# Patient Record
Sex: Female | Born: 1960 | Race: White | Hispanic: No | Marital: Single | Smoking: Former smoker
Health system: Southern US, Community
[De-identification: ages and names within clinical notes are randomized; demographics above are authoritative.]

## PROBLEM LIST (undated history)

## (undated) ENCOUNTER — Ambulatory Visit (HOSPITAL_BASED_OUTPATIENT_CLINIC_OR_DEPARTMENT_OTHER): Source: Home / Self Care

## (undated) DIAGNOSIS — J45909 Unspecified asthma, uncomplicated: Secondary | ICD-10-CM

## (undated) DIAGNOSIS — G4730 Sleep apnea, unspecified: Secondary | ICD-10-CM

## (undated) DIAGNOSIS — M1990 Unspecified osteoarthritis, unspecified site: Secondary | ICD-10-CM

## (undated) DIAGNOSIS — M810 Age-related osteoporosis without current pathological fracture: Secondary | ICD-10-CM

## (undated) DIAGNOSIS — F319 Bipolar disorder, unspecified: Secondary | ICD-10-CM

## (undated) DIAGNOSIS — Z87442 Personal history of urinary calculi: Secondary | ICD-10-CM

## (undated) DIAGNOSIS — G4700 Insomnia, unspecified: Secondary | ICD-10-CM

## (undated) DIAGNOSIS — G43909 Migraine, unspecified, not intractable, without status migrainosus: Secondary | ICD-10-CM

## (undated) DIAGNOSIS — M797 Fibromyalgia: Secondary | ICD-10-CM

## (undated) DIAGNOSIS — K219 Gastro-esophageal reflux disease without esophagitis: Secondary | ICD-10-CM

## (undated) DIAGNOSIS — N189 Chronic kidney disease, unspecified: Secondary | ICD-10-CM

## (undated) DIAGNOSIS — R519 Headache, unspecified: Secondary | ICD-10-CM

## (undated) DIAGNOSIS — F32A Depression, unspecified: Secondary | ICD-10-CM

## (undated) DIAGNOSIS — F419 Anxiety disorder, unspecified: Secondary | ICD-10-CM

## (undated) HISTORY — DX: Insomnia, unspecified: G47.00

## (undated) HISTORY — DX: Chronic kidney disease, unspecified: N18.9

## (undated) HISTORY — DX: Age-related osteoporosis without current pathological fracture: M81.0

## (undated) HISTORY — PX: NO PAST SURGERIES: SHX2092

## (undated) HISTORY — DX: Migraine, unspecified, not intractable, without status migrainosus: G43.909

## (undated) HISTORY — PX: APPENDECTOMY: SHX54

## (undated) HISTORY — DX: Unspecified osteoarthritis, unspecified site: M19.90

## (undated) HISTORY — PX: TUBAL LIGATION: SHX77

## (undated) HISTORY — DX: Unspecified asthma, uncomplicated: J45.909

## (undated) HISTORY — PX: CHOLECYSTECTOMY: SHX55

---

## 2001-05-27 ENCOUNTER — Other Ambulatory Visit: Admission: RE | Admit: 2001-05-27 | Discharge: 2001-05-27 | Payer: Self-pay | Admitting: Obstetrics and Gynecology

## 2016-04-08 HISTORY — PX: ABDOMINAL HYSTERECTOMY: SHX81

## 2016-11-19 ENCOUNTER — Emergency Department (HOSPITAL_COMMUNITY): Payer: Self-pay

## 2016-11-19 ENCOUNTER — Emergency Department (HOSPITAL_COMMUNITY)
Admission: EM | Admit: 2016-11-19 | Discharge: 2016-11-19 | Disposition: A | Discharge status: Home / Self Care | Payer: Self-pay | Attending: Emergency Medicine | Admitting: Emergency Medicine

## 2016-11-19 DIAGNOSIS — R109 Unspecified abdominal pain: Secondary | ICD-10-CM

## 2016-11-19 DIAGNOSIS — Z9049 Acquired absence of other specified parts of digestive tract: Secondary | ICD-10-CM | POA: Insufficient documentation

## 2016-11-19 DIAGNOSIS — R197 Diarrhea, unspecified: Secondary | ICD-10-CM | POA: Insufficient documentation

## 2016-11-19 DIAGNOSIS — N200 Calculus of kidney: Secondary | ICD-10-CM | POA: Insufficient documentation

## 2016-11-19 DIAGNOSIS — R300 Dysuria: Secondary | ICD-10-CM | POA: Insufficient documentation

## 2016-11-19 DIAGNOSIS — R110 Nausea: Secondary | ICD-10-CM | POA: Insufficient documentation

## 2016-11-19 DIAGNOSIS — F17200 Nicotine dependence, unspecified, uncomplicated: Secondary | ICD-10-CM | POA: Insufficient documentation

## 2016-11-19 DIAGNOSIS — K5090 Crohn's disease, unspecified, without complications: Secondary | ICD-10-CM | POA: Insufficient documentation

## 2016-11-19 DIAGNOSIS — R079 Chest pain, unspecified: Secondary | ICD-10-CM | POA: Insufficient documentation

## 2016-11-19 LAB — CBC
HEMATOCRIT: 41.9 % (ref 36.0–46.0)
HEMOGLOBIN: 13.8 g/dL (ref 12.0–15.0)
MCH: 31.4 pg (ref 26.0–34.0)
MCHC: 32.9 g/dL (ref 30.0–36.0)
MCV: 95.4 fL (ref 78.0–100.0)
Platelets: 273 10*3/uL (ref 150–400)
RBC: 4.39 MIL/uL (ref 3.87–5.11)
RDW: 13.2 % (ref 11.5–15.5)
WBC: 11.6 10*3/uL — AB (ref 4.0–10.5)

## 2016-11-19 LAB — BASIC METABOLIC PANEL
ANION GAP: 13 (ref 5–15)
BUN: 11 mg/dL (ref 6–20)
CHLORIDE: 102 mmol/L (ref 101–111)
CO2: 20 mmol/L — ABNORMAL LOW (ref 22–32)
Calcium: 8.6 mg/dL — ABNORMAL LOW (ref 8.9–10.3)
Creatinine, Ser: 1.07 mg/dL — ABNORMAL HIGH (ref 0.44–1.00)
GFR calc Af Amer: >60 mL/min (ref >60)
GFR, EST NON AFRICAN AMERICAN: 59 mL/min — AB (ref >60)
Glucose, Bld: 136 mg/dL — ABNORMAL HIGH (ref 65–99)
POTASSIUM: 3.8 mmol/L (ref 3.5–5.1)
SODIUM: 135 mmol/L (ref 135–145)

## 2016-11-19 MED ORDER — FENTANYL CITRATE (PF) 100 MCG/2ML IJ SOLN
INTRAMUSCULAR | Status: AC
  Filled 2016-11-19: qty 2

## 2016-11-19 MED ORDER — HYDROMORPHONE HCL 1 MG/ML IJ SOLN
1.0000 mg | Freq: Once | INTRAMUSCULAR | Status: AC
  Administered 2016-11-19: 1 mg via INTRAVENOUS
  Filled 2016-11-19: qty 1

## 2016-11-19 MED ORDER — KETOROLAC TROMETHAMINE 30 MG/ML IJ SOLN
30.0000 mg | Freq: Once | INTRAMUSCULAR | Status: AC
  Administered 2016-11-19: 30 mg via INTRAVENOUS
  Filled 2016-11-19: qty 1

## 2016-11-19 MED ORDER — ONDANSETRON HCL 4 MG/2ML IJ SOLN
INTRAMUSCULAR | Status: AC
  Filled 2016-11-19: qty 2

## 2016-11-19 MED ORDER — FENTANYL CITRATE (PF) 100 MCG/2ML IJ SOLN
50.0000 ug | INTRAMUSCULAR | Status: DC | PRN
  Administered 2016-11-19: 50 ug via INTRAVENOUS

## 2016-11-19 MED ORDER — ONDANSETRON HCL 4 MG/2ML IJ SOLN
4.0000 mg | Freq: Once | INTRAMUSCULAR | Status: AC
  Administered 2016-11-19: 4 mg via INTRAVENOUS

## 2016-11-19 NOTE — ED Notes (Signed)
Pt attempted to use the restroom, unable to pee.

## 2016-11-19 NOTE — ED Notes (Signed)
Placed bedpan under pt, pt wanted the bed pan to stay under her.

## 2016-11-19 NOTE — ED Provider Notes (Signed)
11:27 PM Pt also seena nd examined by me. Pt with sudden on set of left flank pain radiating into left groin. Hx of crohns disease, currently well controlled. Pt's VS are normal. She is however appears to be in severe pain, she is moaning and rolling around in the stretcher. Labs ordered. Abdomen tender in LLQ. Peripheral pulses intact. Question kidney stone. Will get ct renal study for evaluation.   12:53 AM CT consistent with two punctate stones in distal UVJ. Pain initially improved with dilaudid and toradol but now starting to come back. Will give another dose of dilaudid.   Signed out to PA [place] at shift change. Unable to give UA at this time. Plan follow up on UA and pain management.   Vitals:   11/19/16 2211  BP: 137/89  Pulse: 71  Resp: (!) 22  Temp: 98.2 F (36.8 C)  TempSrc: Oral  SpO2: 100%      Jaynie Crumble, PA-C 11/20/16 5277    Tegeler, Canary Brim, MD 11/20/16 1245

## 2016-11-19 NOTE — ED Triage Notes (Signed)
Pt had abdominal pain last night with diarrhea. Had sudden onset of L flank and lower abdominal pain two hours ago. Reports this pain is much more severe than pain associated with crohns. Pt covered in vomit, moaning loudly in triage

## 2016-11-19 NOTE — ED Provider Notes (Signed)
[facility]EM[facility]ENCY DEPT Provider Note   CSN: [id number] Arrival date & time: 11/19/16  2159     History   Chief Complaint Chief Complaint  Patient presents with  . Flank Pain  . Abdominal Pain    HPI Barbara Edwards is a 56 y.o. female.  HPI   Barbara Edwards is a 56 y.o. female with a history of Crohn's disease and multiple abdominal surgeries, presents for evaluation of left flank and left lower abdominal pain. Patient reports non-specific abdominal pain began this afternoon with associated diarrhea and nausea. Patient states about 2 hours ago pain became very severe and concentrated in the left flank and lower abdomen. Patient denies any aggravating or alleviating factors and did not take anything prior to arrival to alleviate the pain. Patient reports pain with urination, last void earlier today. Patient has vomited several times since arrival. Patient denies any blood or dark stools or emesis, denies epigastric pain. Patient reports some chest pain over the past two days, which she describes as "like a toothache", but denies current chest pain and reports history of GERD. Patient exhibits significant discomfort throughout interview and is very restless and history is limited. Patient denies history of renal stones, reports multiple UTI's in the past. She reports her Crohn's has been in remission and does not require chronic medication.  Past Medical History:  Diagnosis Date  . Crohn's disease (HCC)     There are no active problems to display for this patient.   Past Surgical History:  Procedure Laterality Date  . ABDOMINAL HYSTERECTOMY    . APPENDECTOMY    . CHOLECYSTECTOMY    . TUBAL LIGATION      OB History    No data available       Home Medications    Prior to Admission medications   Not on File    Family History No family history on file.  Social History Social History  Substance Use Topics  . Smoking status: Current Every Day Smoker  . Smokeless  tobacco: Never Used  . Alcohol use Yes     Allergies   Patient has no known allergies.   Review of Systems Review of Systems  Constitutional: Positive for appetite change (decreased). Negative for fever.  Respiratory: Negative for chest tightness and shortness of breath.   Cardiovascular: Positive for chest pain. Negative for palpitations.  Gastrointestinal: Positive for abdominal pain, diarrhea, nausea and vomiting. Negative for abdominal distention and blood in stool.  Genitourinary: Positive for dysuria and flank pain. Negative for pelvic pain.  All other systems reviewed and are negative.    Physical Exam Updated Vital Signs BP 137/89 (BP Location: Left Arm)   Pulse 71   Temp 98.2 F (36.8 C) (Oral)   Resp (!) 22   SpO2 100%   Physical Exam  Constitutional: She is oriented to person, place, and time. She appears well-developed and well-nourished. She appears distressed (patietn in significant pain throughout exam).  HENT:  Head: Normocephalic and atraumatic.  Neck: Neck supple.  Cardiovascular: Normal rate, regular rhythm, normal heart sounds, intact distal pulses and normal pulses.   Pulmonary/Chest: Effort normal and breath sounds normal.  Abdominal: Soft. Bowel sounds are normal. She exhibits no pulsatile midline mass and no mass. There is tenderness in the suprapubic area and left lower quadrant. There is CVA tenderness (mild left sided).  Neurological: She is alert and oriented to person, place, and time.  Skin: Skin is warm and dry.  Nursing note and vitals  reviewed.    ED Treatments / Results  Labs (all labs ordered are listed, but only abnormal results are displayed) Labs Reviewed  BASIC METABOLIC PANEL - Abnormal; Notable for the following:       Result Value   CO2 20 (*)    Glucose, Bld 136 (*)    Creatinine, Ser 1.07 (*)    Calcium 8.6 (*)    GFR calc non Af Amer 59 (*)    All other components within normal limits  CBC - Abnormal; Notable for the  following:    WBC 11.6 (*)    All other components within normal limits  URINALYSIS, ROUTINE W REFLEX MICROSCOPIC    EKG  EKG Interpretation  Date/Time:  Tuesday November 19 2016 23:13:27 EDT Ventricular Rate:  76 PR Interval:    QRS Duration: 98 QT Interval:  410 QTC Calculation: 461 R Axis:   91 Text Interpretation:  Sinus rhythm Borderline right axis deviation Otherwise within normal limits No old tracing to compare Confirmed by Dione Booze ([id number]) on 11/19/2016 11:17:30 PM       Radiology Ct Renal Stone Study  Result Date: 11/19/2016 CLINICAL DATA:  Flank pain with nausea vomiting, left-sided history of Crohn's EXAM: CT ABDOMEN AND PELVIS WITHOUT CONTRAST TECHNIQUE: Multidetector CT imaging of the abdomen and pelvis was performed following the standard protocol without IV contrast. COMPARISON:  None. FINDINGS: Lower chest: Lung bases demonstrate no acute consolidation or pleural effusion. Normal heart size Hepatobiliary: No focal liver abnormality is seen. Status post cholecystectomy. No biliary dilatation. Pancreas: Unremarkable. No pancreatic ductal dilatation or surrounding inflammatory changes. Spleen: Normal in size without focal abnormality. Adrenals/Urinary Tract: Adrenal glands are within normal limits. Mild left perinephric fat stranding. Mild left hydronephrosis and hydroureter. Coronal views demonstrate a punctate stone within the left aspect of the bladder, series 5, image number 42. There is an additional punctate stone at the base of the bladder, series 5, image number 40. Negative for right hydronephrosis. Bladder otherwise normal Stomach/Bowel: Stomach within normal limits. No dilated small bowel. Slight wall thickening of the transverse colon felt due to underdistention. No definitive inflammation. Minimal sigmoid colon diverticula. Vascular/Lymphatic: No significant vascular findings are present. No enlarged abdominal or pelvic lymph nodes. Reproductive: Uterus and  bilateral adnexa are unremarkable. Other: Negative for free air or free fluid Musculoskeletal: No acute or significant osseous findings. IMPRESSION: 1. Mild left perinephric fat stranding. Mild left hydronephrosis and hydroureter. Punctate stone within or just distal to the left UVJ. Additional punctate stone at the base of the bladder. Findings consistent with recently passed or imminent passage of stones. 2. Slight wall thickening of transverse colon, likely due to underdistention, no convincing evidence for inflammation at this time Electronically Signed   By: Jasmine Pang M.D.   On: 11/19/2016 23:58    Procedures Procedures (including critical care time)  Medications Ordered in ED Medications  fentaNYL (SUBLIMAZE) injection 50 mcg (50 mcg Intravenous Given 11/19/16 2215)  ondansetron (ZOFRAN) 4 MG/2ML injection (not administered)  fentaNYL (SUBLIMAZE) 100 MCG/2ML injection (not administered)  sodium chloride 0.9 % bolus 500 mL (not administered)  HYDROmorphone (DILAUDID) injection 1 mg (not administered)  tamsulosin (FLOMAX) capsule 0.4 mg (not administered)  ondansetron (ZOFRAN) injection 4 mg (4 mg Intravenous Given 11/19/16 2215)  HYDROmorphone (DILAUDID) injection 1 mg (1 mg Intravenous Given 11/19/16 2319)  ketorolac (TORADOL) 30 MG/ML injection 30 mg (30 mg Intravenous Given 11/19/16 2351)     Initial Impression / Assessment and Plan / ED Course  I have reviewed the triage vital signs and the nursing notes.  Pertinent labs & imaging results that were available during my care of the patient were reviewed by me and considered in my medical decision making (see chart for details).  10:55 PM Patient seen and examined, presents with L flank and lower abdominal pain with associated nausea and vomiting. Pt reports some chest pain, EKG ordered. Given fentanyl in triage, with little improvement in pain. Dilaudid ordered. CBC, BMP and UA pending. Concerned for kidney stone given presentation,  will also consider mesenteric ischemia given significant pain. CT stone study ordered.  11:29 PM Patient reports slight improvement in pain, continues to moan and is restless, vital signs remain stable. CBC shows mild leukocytosis and Cr is 1.07, no previous labs available for comparison.  12:23 AM CT stone study showed mild left sided hydro and perinephric fat stranding with punctate stone at UVJ and additional stone at base of bladder, imminent passage likely. Results discussed with patient. Patient reports pain has greatly improved. Will give 500 mL bolus to encourage voiding as UA still needed to rule out infection before patient can be discharged.  12:53 AM Patient signed out to [employer], PA-C pending UA to rule out infection and continued pain management. Plan for discharge home with pain medication and flomax and f/u with Urology.   Final Clinical Impressions(s) / ED Diagnoses   Final diagnoses:  Kidney stone  Flank pain    New Prescriptions New Prescriptions   No medications on file     Legrand Rams 11/20/16 0059    Zadie Rhine, MD 11/20/16 [phone]

## 2016-11-20 ENCOUNTER — Emergency Department (HOSPITAL_COMMUNITY)
Admission: EM | Admit: 2016-11-20 | Discharge: 2016-11-20 | Disposition: A | Discharge status: Home / Self Care | Payer: Self-pay | Attending: Emergency Medicine | Admitting: Emergency Medicine

## 2016-11-20 DIAGNOSIS — N200 Calculus of kidney: Secondary | ICD-10-CM

## 2016-11-20 DIAGNOSIS — N3000 Acute cystitis without hematuria: Secondary | ICD-10-CM

## 2016-11-20 LAB — URINALYSIS, ROUTINE W REFLEX MICROSCOPIC
Bilirubin Urine: NEGATIVE
Glucose, UA: NEGATIVE mg/dL
Ketones, ur: NEGATIVE mg/dL
Nitrite: POSITIVE — AB
PROTEIN: 30 mg/dL — AB
SPECIFIC GRAVITY, URINE: 1.018 (ref 1.005–1.030)
pH: 6 (ref 5.0–8.0)

## 2016-11-20 MED ORDER — ONDANSETRON HCL 4 MG PO TABS: 4.0000 mg | ORAL_TABLET | Freq: Four times a day (QID) | ORAL | 0 refills | Status: DC

## 2016-11-20 MED ORDER — HYDROMORPHONE HCL 1 MG/ML IJ SOLN
1.0000 mg | Freq: Once | INTRAMUSCULAR | Status: AC
  Administered 2016-11-20: 1 mg via INTRAVENOUS
  Filled 2016-11-20: qty 1

## 2016-11-20 MED ORDER — SODIUM CHLORIDE 0.9 % IV BOLUS (SEPSIS)
500.0000 mL | Freq: Once | INTRAVENOUS | Status: AC
  Administered 2016-11-20: 500 mL via INTRAVENOUS

## 2016-11-20 MED ORDER — DEXTROSE 5 % IV SOLN
1.0000 g | Freq: Once | INTRAVENOUS | Status: AC
  Administered 2016-11-20: 1 g via INTRAVENOUS
  Filled 2016-11-20: qty 10

## 2016-11-20 MED ORDER — HYDROCODONE-ACETAMINOPHEN 5-325 MG PO TABS: 1.0000 | ORAL_TABLET | Freq: Four times a day (QID) | ORAL | 0 refills | Status: DC | PRN

## 2016-11-20 MED ORDER — TAMSULOSIN HCL 0.4 MG PO CAPS
0.4000 mg | ORAL_CAPSULE | Freq: Once | ORAL | Status: AC
  Administered 2016-11-20: 0.4 mg via ORAL
  Filled 2016-11-20: qty 1

## 2016-11-20 MED ORDER — CEPHALEXIN 500 MG PO CAPS: 500.0000 mg | ORAL_CAPSULE | Freq: Four times a day (QID) | ORAL | 0 refills | Status: DC

## 2016-11-20 NOTE — ED Notes (Signed)
Birthday confirmed with patient as 02-20-1961

## 2016-11-20 NOTE — ED Notes (Signed)
Patient on the bedpan attempting to give urine sample

## 2016-11-20 NOTE — ED Notes (Signed)
Patient Alert and oriented X4. Stable and ambulatory. Patient verbalized understanding of the discharge instructions.  Patient belongings were taken by the patient.  

## 2016-11-20 NOTE — ED Provider Notes (Signed)
Patient signed out to me.  Patient with CT showing punctate KS.  UA pending.    UA concerning for infection.  Rocephin in ED.  Home with keflex.  Urine culture pending.  PCP and urology follow-up.  Return precautions given.   Original documentation performed on incorrect patient.     Below is a copy of the H&P from the prior providers which was intended for this patient.  This is listed as reference only.  Legrand Rams  11/20/2016 00:59  Cosign Required  Expand All Collapse All    [facility] Provider Note   CSN: [id number] Arrival date & time: 11/19/16  2159     History              Chief Complaint    Chief Complaint  Patient presents with  . Flank Pain  . Abdominal Pain    HPI female with a history of Crohn's disease and multiple abdominal surgeries, presents for evaluation of left flank and left lower abdominal pain. Patient reports non-specific abdominal pain began this afternoon with associated diarrhea and nausea. Patient states about 2 hours ago pain became very severe and concentrated in the left flank and lower abdomen. Patient denies any aggravating or alleviating factors and did not take anything prior to arrival to alleviate the pain. Patient reports pain with urination, last void earlier today. Patient has vomited several times since arrival. Patient denies any blood or dark stools or emesis, denies epigastric pain. Patient reports some chest pain over the past two days, which she describes as "like a toothache", but denies current chest pain and reports history of GERD. Patient exhibits significant discomfort throughout interview and is very restless and history is limited. Patient denies history of renal stones, reports multiple UTI's in the past. She reports her Crohn's has been in remission and does not require chronic medication.      Past Medical History:  Diagnosis Date  . Crohn's disease (HCC)     There are no active problems to  display for this patient.        Past Surgical History:  Procedure Laterality Date  . ABDOMINAL HYSTERECTOMY    . APPENDECTOMY    . CHOLECYSTECTOMY    . TUBAL LIGATION         OB History    No data available       Home Medications     Prior to Admission medications   Not on File    Family History No family history on file.  Social History     Social History  Substance Use Topics  . Smoking status: Current Every Day Smoker  . Smokeless tobacco: Never Used  . Alcohol use Yes     Allergies           Patient has no known allergies.   Review of Systems Review of Systems  Constitutional: Positive for appetite change (decreased). Negative for fever.  Respiratory: Negative for chest tightness and shortness of breath.   Cardiovascular: Positive for chest pain. Negative for palpitations.  Gastrointestinal: Positive for abdominal pain, diarrhea, nausea and vomiting. Negative for abdominal distention and blood in stool.  Genitourinary: Positive for dysuria and flank pain. Negative for pelvic pain.  All other systems reviewed and are negative.    Physical Exam Updated Vital Signs BP 137/89 (BP Location: Left Arm)   Pulse 71   Temp 98.2 F (36.8 C) (Oral)   Resp (!) 22   SpO2 100%   Physical  Exam  Constitutional: She is oriented to person, place, and time. She appears well-developed and well-nourished. She appears distressed (patietn in significant pain throughout exam).  HENT:  Head: Normocephalic and atraumatic.  Neck: Neck supple.  Cardiovascular: Normal rate, regular rhythm, normal heart sounds, intact distal pulses and normal pulses.   Pulmonary/Chest: Effort normal and breath sounds normal.  Abdominal: Soft. Bowel sounds are normal. She exhibits no pulsatile midline mass and no mass. There is tenderness in the suprapubic area and left lower quadrant. There is CVA tenderness (mild left sided).  Neurological: She is alert and  oriented to person, place, and time.  Skin: Skin is warm and dry.  Nursing note and vitals reviewed.    ED Treatments / Results  Labs (all labs ordered are listed, but only abnormal results are displayed)      Labs Reviewed  BASIC METABOLIC PANEL - Abnormal; Notable for the following:       Result Value    CO2 20 (*)    Glucose, Bld 136 (*)    Creatinine, Ser 1.07 (*)    Calcium 8.6 (*)    GFR calc non Af Amer 59 (*)    All other components within normal limits  CBC - Abnormal; Notable for the following:    WBC 11.6 (*)    All other components within normal limits  URINALYSIS, ROUTINE W REFLEX MICROSCOPIC    EKG      EKG Interpretation  Date/Time:                  Tuesday November 19 2016 23:13:27 EDT Ventricular Rate:   76 PR Interval:                        QRS Duration:        98 QT Interval:                      410 QTC Calculation:    461 R Axis:                         91 Text Interpretation:  Sinus rhythm Borderline right axis deviation Otherwise within normal limits No old tracing to compare Confirmed by Dione Booze ([id number]) on 11/19/2016 11:17:30 PM       Radiology  ImagingResults(Last48hours)  Ct Renal Stone Study  Result Date: 11/19/2016 CLINICAL DATA:  Flank pain with nausea vomiting, left-sided history of Crohn's EXAM: CT ABDOMEN AND PELVIS WITHOUT CONTRAST TECHNIQUE: Multidetector CT imaging of the abdomen and pelvis was performed following the standard protocol without IV contrast. COMPARISON:  None. FINDINGS: Lower chest: Lung bases demonstrate no acute consolidation or pleural effusion. Normal heart size Hepatobiliary: No focal liver abnormality is seen. Status post cholecystectomy. No biliary dilatation. Pancreas: Unremarkable. No pancreatic ductal dilatation or surrounding inflammatory changes. Spleen: Normal in size without focal abnormality. Adrenals/Urinary Tract: Adrenal glands are within normal limits. Mild left  perinephric fat stranding. Mild left hydronephrosis and hydroureter. Coronal views demonstrate a punctate stone within the left aspect of the bladder, series 5, image number 42. There is an additional punctate stone at the base of the bladder, series 5, image number 40. Negative for right hydronephrosis. Bladder otherwise normal Stomach/Bowel: Stomach within normal limits. No dilated small bowel. Slight wall thickening of the transverse colon felt due to underdistention. No definitive inflammation. Minimal sigmoid colon diverticula. Vascular/Lymphatic: No significant vascular findings are present. No enlarged abdominal  or pelvic lymph nodes. Reproductive: Uterus and bilateral adnexa are unremarkable. Other: Negative for free air or free fluid Musculoskeletal: No acute or significant osseous findings. IMPRESSION: 1. Mild left perinephric fat stranding. Mild left hydronephrosis and hydroureter. Punctate stone within or just distal to the left UVJ. Additional punctate stone at the base of the bladder. Findings consistent with recently passed or imminent passage of stones. 2. Slight wall thickening of transverse colon, likely due to underdistention, no convincing evidence for inflammation at this time Electronically Signed   By: Jasmine Pang M.D.   On: 11/19/2016 23:58     Procedures Procedures (including critical care time)  Medications Ordered in ED Medications  fentaNYL (SUBLIMAZE) injection 50 mcg (50 mcg Intravenous Given 11/19/16 2215)  ondansetron (ZOFRAN) 4 MG/2ML injection (not administered)  fentaNYL (SUBLIMAZE) 100 MCG/2ML injection (not administered)  sodium chloride 0.9 % bolus 500 mL (not administered)  HYDROmorphone (DILAUDID) injection 1 mg (not administered)  tamsulosin (FLOMAX) capsule 0.4 mg (not administered)  ondansetron (ZOFRAN) injection 4 mg (4 mg Intravenous Given 11/19/16 2215)  HYDROmorphone (DILAUDID) injection 1 mg (1 mg Intravenous Given 11/19/16 2319)  ketorolac (TORADOL)  30 MG/ML injection 30 mg (30 mg Intravenous Given 11/19/16 2351)     Initial Impression / Assessment and Plan / ED Course  I have reviewed the triage vital signs and the nursing notes.  Pertinent labs & imaging results that were available during my care of the patient were reviewed by me and considered in my medical decision making (see chart for details).  10:55 PM Patient seen and examined, presents with L flank and lower abdominal pain with associated nausea and vomiting. Pt reports some chest pain, EKG ordered. Given fentanyl in triage, with little improvement in pain. Dilaudid ordered. CBC, BMP and UA pending. Concerned for kidney stone given presentation, will also consider mesenteric ischemia given significant pain. CT stone study ordered.  11:29 PM Patient reports slight improvement in pain, continues to moan and is restless, vital signs remain stable. CBC shows mild leukocytosis and Cr is 1.07, no previous labs available for comparison.  12:23 AM CT stone study showed mild left sided hydro and perinephric fat stranding with punctate stone at UVJ and additional stone at base of bladder, imminent passage likely. Results discussed with patient. Patient reports pain has greatly improved. Will give 500 mL bolus to encourage voiding as UA still needed to rule out infection before patient can be discharged.  12:53 AM Patient signed out to [employer], PA-C pending UA to rule out infection and continued pain management. Plan for discharge home with pain medication and flomax and f/u with Urology.   Final Clinical Impressions(s) / ED Diagnoses   Final diagnoses:  Kidney stone  Flank pain    New Prescriptions    New Prescriptions   No medications on file     Legrand Rams 11/20/16 1610     Electronically signed by Dartha Lodge, PA-C at 11/20/2016 12:59 AM  Jaynie Crumble, PA-C  11/20/2016 00:55  Cosign Required    11:27 PM Pt also seena nd examined  by me. Pt with sudden on set of left flank pain radiating into left groin. Hx of crohns disease, currently well controlled. Pt's VS are normal. She is however appears to be in severe pain, she is moaning and rolling around in the stretcher. Labs ordered. Abdomen tender in LLQ. Peripheral pulses intact. Question kidney stone. Will get ct renal study for evaluation.   12:53 AM CT  consistent with two punctate stones in distal UVJ. Pain initially improved with dilaudid and toradol but now starting to come back. Will give another dose of dilaudid.   Signed out to PA [place] at shift change. Unable to give UA at this time. Plan follow up on UA and pain management.      Vitals:   11/19/16 2211  BP: 137/89  Pulse: 71  Resp: (!) 22  Temp: 98.2 F (36.8 C)  TempSrc: Oral  SpO2: 100%      Jaynie Crumble, PA-C 11/20/16 0055        Roxy Horseman, PA-C 11/20/16 0510    Zadie Rhine, MD 11/20/16 [phone]

## 2016-11-20 NOTE — ED Notes (Signed)
Arm band scanned, patient has wrong birthday in computer.  Registration aware.

## 2016-11-22 LAB — URINE CULTURE: Culture: >=100000 — AB

## 2016-11-23 ENCOUNTER — Telehealth: Payer: Self-pay

## 2016-11-23 NOTE — Telephone Encounter (Signed)
Post ED Visit - Positive Culture Follow-up  Culture report reviewed by antimicrobial stewardship pharmacist:  []  Enzo Bi, Pharm.D. []  Celedonio Miyamoto, Pharm.D., BCPS AQ-ID []  Garvin Fila, Pharm.D., BCPS []  Georgina Pillion, Pharm.D., BCPS []  [place], [address].D., BCPS, AAHIVP []  Estella Husk, Pharm.D., BCPS, AAHIVP []  Lysle Pearl, PharmD, BCPS []  Casilda Carls, PharmD, BCPS []  Pollyann Samples, PharmD, BCPS Madlyn Frankel  Pharm D Positive urine culture Treated with Cephalexin, organism sensitive to the same and no further patient follow-up is required at this time.  Jerry Caras 11/23/2016, 11:22 AM

## 2017-04-21 DIAGNOSIS — K5000 Crohn's disease of small intestine without complications: Secondary | ICD-10-CM | POA: Diagnosis not present

## 2017-04-21 DIAGNOSIS — J00 Acute nasopharyngitis [common cold]: Secondary | ICD-10-CM | POA: Diagnosis not present

## 2017-05-08 DIAGNOSIS — Z1331 Encounter for screening for depression: Secondary | ICD-10-CM | POA: Diagnosis not present

## 2017-05-08 DIAGNOSIS — Z0001 Encounter for general adult medical examination with abnormal findings: Secondary | ICD-10-CM | POA: Diagnosis not present

## 2017-05-08 DIAGNOSIS — R51 Headache: Secondary | ICD-10-CM | POA: Diagnosis not present

## 2017-05-08 DIAGNOSIS — R632 Polyphagia: Secondary | ICD-10-CM | POA: Diagnosis not present

## 2017-05-08 DIAGNOSIS — R198 Other specified symptoms and signs involving the digestive system and abdomen: Secondary | ICD-10-CM | POA: Diagnosis not present

## 2017-05-08 DIAGNOSIS — R42 Dizziness and giddiness: Secondary | ICD-10-CM | POA: Diagnosis not present

## 2017-06-27 DIAGNOSIS — N390 Urinary tract infection, site not specified: Secondary | ICD-10-CM | POA: Diagnosis not present

## 2017-07-08 DIAGNOSIS — M9901 Segmental and somatic dysfunction of cervical region: Secondary | ICD-10-CM | POA: Diagnosis not present

## 2017-07-08 DIAGNOSIS — M50322 Other cervical disc degeneration at C5-C6 level: Secondary | ICD-10-CM | POA: Diagnosis not present

## 2017-07-08 DIAGNOSIS — M5412 Radiculopathy, cervical region: Secondary | ICD-10-CM | POA: Diagnosis not present

## 2017-07-08 DIAGNOSIS — M9902 Segmental and somatic dysfunction of thoracic region: Secondary | ICD-10-CM | POA: Diagnosis not present

## 2017-07-15 DIAGNOSIS — M50322 Other cervical disc degeneration at C5-C6 level: Secondary | ICD-10-CM | POA: Diagnosis not present

## 2017-07-15 DIAGNOSIS — M5412 Radiculopathy, cervical region: Secondary | ICD-10-CM | POA: Diagnosis not present

## 2017-07-15 DIAGNOSIS — M9902 Segmental and somatic dysfunction of thoracic region: Secondary | ICD-10-CM | POA: Diagnosis not present

## 2017-07-15 DIAGNOSIS — M9901 Segmental and somatic dysfunction of cervical region: Secondary | ICD-10-CM | POA: Diagnosis not present

## 2017-07-16 DIAGNOSIS — M9901 Segmental and somatic dysfunction of cervical region: Secondary | ICD-10-CM | POA: Diagnosis not present

## 2017-07-16 DIAGNOSIS — M5412 Radiculopathy, cervical region: Secondary | ICD-10-CM | POA: Diagnosis not present

## 2017-07-16 DIAGNOSIS — M50322 Other cervical disc degeneration at C5-C6 level: Secondary | ICD-10-CM | POA: Diagnosis not present

## 2017-07-16 DIAGNOSIS — M9902 Segmental and somatic dysfunction of thoracic region: Secondary | ICD-10-CM | POA: Diagnosis not present

## 2017-07-18 DIAGNOSIS — M9901 Segmental and somatic dysfunction of cervical region: Secondary | ICD-10-CM | POA: Diagnosis not present

## 2017-07-18 DIAGNOSIS — M9902 Segmental and somatic dysfunction of thoracic region: Secondary | ICD-10-CM | POA: Diagnosis not present

## 2017-07-18 DIAGNOSIS — M5412 Radiculopathy, cervical region: Secondary | ICD-10-CM | POA: Diagnosis not present

## 2017-07-18 DIAGNOSIS — M50322 Other cervical disc degeneration at C5-C6 level: Secondary | ICD-10-CM | POA: Diagnosis not present

## 2017-07-21 DIAGNOSIS — M9901 Segmental and somatic dysfunction of cervical region: Secondary | ICD-10-CM | POA: Diagnosis not present

## 2017-07-21 DIAGNOSIS — M9902 Segmental and somatic dysfunction of thoracic region: Secondary | ICD-10-CM | POA: Diagnosis not present

## 2017-07-21 DIAGNOSIS — M50322 Other cervical disc degeneration at C5-C6 level: Secondary | ICD-10-CM | POA: Diagnosis not present

## 2017-07-21 DIAGNOSIS — M5412 Radiculopathy, cervical region: Secondary | ICD-10-CM | POA: Diagnosis not present

## 2017-08-26 DIAGNOSIS — M47812 Spondylosis without myelopathy or radiculopathy, cervical region: Secondary | ICD-10-CM | POA: Diagnosis not present

## 2017-08-26 DIAGNOSIS — M5136 Other intervertebral disc degeneration, lumbar region: Secondary | ICD-10-CM | POA: Diagnosis not present

## 2017-09-10 DIAGNOSIS — Z8371 Family history of colonic polyps: Secondary | ICD-10-CM | POA: Diagnosis not present

## 2017-09-10 DIAGNOSIS — Z1211 Encounter for screening for malignant neoplasm of colon: Secondary | ICD-10-CM | POA: Diagnosis not present

## 2017-09-10 DIAGNOSIS — R1084 Generalized abdominal pain: Secondary | ICD-10-CM | POA: Diagnosis not present

## 2017-09-10 DIAGNOSIS — K5900 Constipation, unspecified: Secondary | ICD-10-CM | POA: Diagnosis not present

## 2017-09-10 DIAGNOSIS — Z86010 Personal history of colonic polyps: Secondary | ICD-10-CM | POA: Diagnosis not present

## 2019-04-29 DIAGNOSIS — R002 Palpitations: Secondary | ICD-10-CM | POA: Diagnosis not present

## 2019-05-26 DIAGNOSIS — R002 Palpitations: Secondary | ICD-10-CM | POA: Diagnosis not present

## 2019-06-22 DIAGNOSIS — N951 Menopausal and female climacteric states: Secondary | ICD-10-CM | POA: Diagnosis not present

## 2019-06-22 DIAGNOSIS — K5792 Diverticulitis of intestine, part unspecified, without perforation or abscess without bleeding: Secondary | ICD-10-CM | POA: Insufficient documentation

## 2019-06-22 DIAGNOSIS — N200 Calculus of kidney: Secondary | ICD-10-CM | POA: Insufficient documentation

## 2019-06-22 DIAGNOSIS — K829 Disease of gallbladder, unspecified: Secondary | ICD-10-CM | POA: Insufficient documentation

## 2019-06-22 DIAGNOSIS — Z6827 Body mass index (BMI) 27.0-27.9, adult: Secondary | ICD-10-CM | POA: Diagnosis not present

## 2019-06-22 DIAGNOSIS — R309 Painful micturition, unspecified: Secondary | ICD-10-CM | POA: Diagnosis not present

## 2019-06-22 DIAGNOSIS — R159 Full incontinence of feces: Secondary | ICD-10-CM | POA: Diagnosis not present

## 2019-06-22 DIAGNOSIS — K589 Irritable bowel syndrome without diarrhea: Secondary | ICD-10-CM | POA: Insufficient documentation

## 2019-06-22 DIAGNOSIS — M545 Low back pain: Secondary | ICD-10-CM | POA: Diagnosis not present

## 2019-06-22 DIAGNOSIS — N390 Urinary tract infection, site not specified: Secondary | ICD-10-CM | POA: Diagnosis not present

## 2019-06-22 DIAGNOSIS — G43909 Migraine, unspecified, not intractable, without status migrainosus: Secondary | ICD-10-CM | POA: Insufficient documentation

## 2019-06-22 DIAGNOSIS — D649 Anemia, unspecified: Secondary | ICD-10-CM

## 2019-06-22 DIAGNOSIS — F418 Other specified anxiety disorders: Secondary | ICD-10-CM | POA: Diagnosis not present

## 2019-06-22 DIAGNOSIS — Z01419 Encounter for gynecological examination (general) (routine) without abnormal findings: Secondary | ICD-10-CM | POA: Diagnosis not present

## 2019-06-22 HISTORY — DX: Anemia, unspecified: D64.9

## 2019-07-12 DIAGNOSIS — M9904 Segmental and somatic dysfunction of sacral region: Secondary | ICD-10-CM | POA: Diagnosis not present

## 2019-07-12 DIAGNOSIS — M9902 Segmental and somatic dysfunction of thoracic region: Secondary | ICD-10-CM | POA: Diagnosis not present

## 2019-07-12 DIAGNOSIS — M9903 Segmental and somatic dysfunction of lumbar region: Secondary | ICD-10-CM | POA: Diagnosis not present

## 2019-07-12 DIAGNOSIS — M9905 Segmental and somatic dysfunction of pelvic region: Secondary | ICD-10-CM | POA: Diagnosis not present

## 2019-07-15 DIAGNOSIS — M545 Low back pain: Secondary | ICD-10-CM | POA: Diagnosis not present

## 2019-07-15 DIAGNOSIS — N390 Urinary tract infection, site not specified: Secondary | ICD-10-CM | POA: Diagnosis not present

## 2019-08-13 DIAGNOSIS — Z13820 Encounter for screening for osteoporosis: Secondary | ICD-10-CM | POA: Diagnosis not present

## 2019-08-13 DIAGNOSIS — Z1231 Encounter for screening mammogram for malignant neoplasm of breast: Secondary | ICD-10-CM | POA: Diagnosis not present

## 2019-08-13 DIAGNOSIS — Z1329 Encounter for screening for other suspected endocrine disorder: Secondary | ICD-10-CM | POA: Diagnosis not present

## 2019-08-13 DIAGNOSIS — Z13220 Encounter for screening for lipoid disorders: Secondary | ICD-10-CM | POA: Diagnosis not present

## 2019-08-13 DIAGNOSIS — Z131 Encounter for screening for diabetes mellitus: Secondary | ICD-10-CM | POA: Diagnosis not present

## 2019-08-13 DIAGNOSIS — M2550 Pain in unspecified joint: Secondary | ICD-10-CM | POA: Diagnosis not present

## 2019-08-13 DIAGNOSIS — R5383 Other fatigue: Secondary | ICD-10-CM | POA: Diagnosis not present

## 2019-08-13 DIAGNOSIS — M8580 Other specified disorders of bone density and structure, unspecified site: Secondary | ICD-10-CM | POA: Diagnosis not present

## 2019-08-13 DIAGNOSIS — Z13228 Encounter for screening for other metabolic disorders: Secondary | ICD-10-CM | POA: Diagnosis not present

## 2019-08-18 DIAGNOSIS — Z86010 Personal history of colonic polyps: Secondary | ICD-10-CM | POA: Diagnosis not present

## 2019-08-18 DIAGNOSIS — R198 Other specified symptoms and signs involving the digestive system and abdomen: Secondary | ICD-10-CM | POA: Diagnosis not present

## 2019-08-18 DIAGNOSIS — R159 Full incontinence of feces: Secondary | ICD-10-CM | POA: Diagnosis not present

## 2019-08-19 DIAGNOSIS — M8580 Other specified disorders of bone density and structure, unspecified site: Secondary | ICD-10-CM | POA: Insufficient documentation

## 2019-08-31 ENCOUNTER — Other Ambulatory Visit: Payer: Self-pay | Admitting: Radiology

## 2019-08-31 ENCOUNTER — Other Ambulatory Visit: Payer: Self-pay

## 2019-08-31 DIAGNOSIS — E049 Nontoxic goiter, unspecified: Secondary | ICD-10-CM | POA: Diagnosis not present

## 2019-08-31 DIAGNOSIS — R1310 Dysphagia, unspecified: Secondary | ICD-10-CM | POA: Diagnosis not present

## 2019-08-31 DIAGNOSIS — M545 Low back pain: Secondary | ICD-10-CM | POA: Diagnosis not present

## 2019-08-31 DIAGNOSIS — J019 Acute sinusitis, unspecified: Secondary | ICD-10-CM | POA: Diagnosis not present

## 2019-09-04 DIAGNOSIS — S9031XA Contusion of right foot, initial encounter: Secondary | ICD-10-CM | POA: Diagnosis not present

## 2019-10-12 DIAGNOSIS — F419 Anxiety disorder, unspecified: Secondary | ICD-10-CM | POA: Diagnosis not present

## 2019-10-12 DIAGNOSIS — M545 Low back pain: Secondary | ICD-10-CM | POA: Diagnosis not present

## 2019-10-12 DIAGNOSIS — M2550 Pain in unspecified joint: Secondary | ICD-10-CM | POA: Diagnosis not present

## 2019-10-12 DIAGNOSIS — R5383 Other fatigue: Secondary | ICD-10-CM | POA: Diagnosis not present

## 2019-10-20 DIAGNOSIS — S5002XA Contusion of left elbow, initial encounter: Secondary | ICD-10-CM | POA: Diagnosis not present

## 2019-10-20 DIAGNOSIS — S50312A Abrasion of left elbow, initial encounter: Secondary | ICD-10-CM | POA: Diagnosis not present

## 2019-10-21 DIAGNOSIS — L0390 Cellulitis, unspecified: Secondary | ICD-10-CM | POA: Diagnosis not present

## 2019-12-20 DIAGNOSIS — R209 Unspecified disturbances of skin sensation: Secondary | ICD-10-CM | POA: Diagnosis not present

## 2019-12-20 DIAGNOSIS — M3500 Sicca syndrome, unspecified: Secondary | ICD-10-CM | POA: Diagnosis not present

## 2019-12-27 DIAGNOSIS — M791 Myalgia, unspecified site: Secondary | ICD-10-CM | POA: Diagnosis not present

## 2019-12-27 DIAGNOSIS — M1990 Unspecified osteoarthritis, unspecified site: Secondary | ICD-10-CM | POA: Diagnosis not present

## 2019-12-27 DIAGNOSIS — M79642 Pain in left hand: Secondary | ICD-10-CM | POA: Diagnosis not present

## 2019-12-27 DIAGNOSIS — M2550 Pain in unspecified joint: Secondary | ICD-10-CM | POA: Diagnosis not present

## 2019-12-27 DIAGNOSIS — M79641 Pain in right hand: Secondary | ICD-10-CM | POA: Diagnosis not present

## 2019-12-27 DIAGNOSIS — G8929 Other chronic pain: Secondary | ICD-10-CM | POA: Diagnosis not present

## 2019-12-27 DIAGNOSIS — M359 Systemic involvement of connective tissue, unspecified: Secondary | ICD-10-CM | POA: Diagnosis not present

## 2019-12-27 DIAGNOSIS — M549 Dorsalgia, unspecified: Secondary | ICD-10-CM | POA: Diagnosis not present

## 2019-12-27 DIAGNOSIS — M542 Cervicalgia: Secondary | ICD-10-CM | POA: Diagnosis not present

## 2020-01-17 DIAGNOSIS — M1990 Unspecified osteoarthritis, unspecified site: Secondary | ICD-10-CM | POA: Diagnosis not present

## 2020-01-17 DIAGNOSIS — M359 Systemic involvement of connective tissue, unspecified: Secondary | ICD-10-CM | POA: Diagnosis not present

## 2020-01-17 DIAGNOSIS — M791 Myalgia, unspecified site: Secondary | ICD-10-CM | POA: Diagnosis not present

## 2020-01-17 DIAGNOSIS — M2550 Pain in unspecified joint: Secondary | ICD-10-CM | POA: Diagnosis not present

## 2020-03-01 ENCOUNTER — Other Ambulatory Visit: Payer: Self-pay | Admitting: Orthopedic Surgery

## 2020-03-09 DIAGNOSIS — M5136 Other intervertebral disc degeneration, lumbar region: Secondary | ICD-10-CM | POA: Diagnosis not present

## 2020-03-09 DIAGNOSIS — S83241A Other tear of medial meniscus, current injury, right knee, initial encounter: Secondary | ICD-10-CM | POA: Diagnosis not present

## 2020-04-19 DIAGNOSIS — M542 Cervicalgia: Secondary | ICD-10-CM | POA: Diagnosis not present

## 2020-04-19 DIAGNOSIS — M7711 Lateral epicondylitis, right elbow: Secondary | ICD-10-CM | POA: Diagnosis not present

## 2020-05-02 DIAGNOSIS — F5101 Primary insomnia: Secondary | ICD-10-CM | POA: Diagnosis not present

## 2020-05-02 DIAGNOSIS — K5900 Constipation, unspecified: Secondary | ICD-10-CM | POA: Diagnosis not present

## 2020-05-02 DIAGNOSIS — F419 Anxiety disorder, unspecified: Secondary | ICD-10-CM | POA: Diagnosis not present

## 2020-05-02 DIAGNOSIS — N390 Urinary tract infection, site not specified: Secondary | ICD-10-CM | POA: Diagnosis not present

## 2020-06-14 DIAGNOSIS — M778 Other enthesopathies, not elsewhere classified: Secondary | ICD-10-CM | POA: Diagnosis not present

## 2020-06-14 DIAGNOSIS — F419 Anxiety disorder, unspecified: Secondary | ICD-10-CM | POA: Diagnosis not present

## 2020-06-14 DIAGNOSIS — F5101 Primary insomnia: Secondary | ICD-10-CM | POA: Diagnosis not present

## 2020-06-14 DIAGNOSIS — M797 Fibromyalgia: Secondary | ICD-10-CM | POA: Diagnosis not present

## 2020-07-04 ENCOUNTER — Emergency Department (HOSPITAL_COMMUNITY): Payer: BC Managed Care – PPO

## 2020-07-04 ENCOUNTER — Encounter (HOSPITAL_COMMUNITY): Payer: Self-pay

## 2020-07-04 ENCOUNTER — Inpatient Hospital Stay (HOSPITAL_COMMUNITY)
Admission: EM | Admit: 2020-07-04 | Discharge: 2020-07-07 | DRG: 690 | Disposition: A | Discharge status: Home / Self Care | Payer: BC Managed Care – PPO | Attending: Internal Medicine | Admitting: Internal Medicine

## 2020-07-04 ENCOUNTER — Other Ambulatory Visit: Payer: Self-pay

## 2020-07-04 DIAGNOSIS — N1330 Unspecified hydronephrosis: Secondary | ICD-10-CM | POA: Diagnosis not present

## 2020-07-04 DIAGNOSIS — Z90710 Acquired absence of both cervix and uterus: Secondary | ICD-10-CM | POA: Diagnosis not present

## 2020-07-04 DIAGNOSIS — K5909 Other constipation: Secondary | ICD-10-CM | POA: Diagnosis present

## 2020-07-04 DIAGNOSIS — R52 Pain, unspecified: Secondary | ICD-10-CM

## 2020-07-04 DIAGNOSIS — R339 Retention of urine, unspecified: Secondary | ICD-10-CM | POA: Diagnosis not present

## 2020-07-04 DIAGNOSIS — N136 Pyonephrosis: Principal | ICD-10-CM | POA: Diagnosis present

## 2020-07-04 DIAGNOSIS — N10 Acute tubulo-interstitial nephritis: Secondary | ICD-10-CM

## 2020-07-04 DIAGNOSIS — I878 Other specified disorders of veins: Secondary | ICD-10-CM | POA: Diagnosis not present

## 2020-07-04 DIAGNOSIS — Z9049 Acquired absence of other specified parts of digestive tract: Secondary | ICD-10-CM

## 2020-07-04 DIAGNOSIS — F418 Other specified anxiety disorders: Secondary | ICD-10-CM | POA: Diagnosis not present

## 2020-07-04 DIAGNOSIS — R509 Fever, unspecified: Secondary | ICD-10-CM | POA: Insufficient documentation

## 2020-07-04 DIAGNOSIS — Z79899 Other long term (current) drug therapy: Secondary | ICD-10-CM | POA: Diagnosis not present

## 2020-07-04 DIAGNOSIS — Z20822 Contact with and (suspected) exposure to covid-19: Secondary | ICD-10-CM | POA: Diagnosis not present

## 2020-07-04 DIAGNOSIS — N12 Tubulo-interstitial nephritis, not specified as acute or chronic: Secondary | ICD-10-CM | POA: Diagnosis not present

## 2020-07-04 DIAGNOSIS — R1030 Lower abdominal pain, unspecified: Secondary | ICD-10-CM | POA: Diagnosis not present

## 2020-07-04 DIAGNOSIS — Z87442 Personal history of urinary calculi: Secondary | ICD-10-CM | POA: Diagnosis not present

## 2020-07-04 DIAGNOSIS — B9620 Unspecified Escherichia coli [E. coli] as the cause of diseases classified elsewhere: Secondary | ICD-10-CM | POA: Diagnosis not present

## 2020-07-04 DIAGNOSIS — K7689 Other specified diseases of liver: Secondary | ICD-10-CM | POA: Diagnosis not present

## 2020-07-04 HISTORY — DX: Depression, unspecified: F32.A

## 2020-07-04 HISTORY — DX: Bipolar disorder, unspecified: F31.9

## 2020-07-04 HISTORY — DX: Fibromyalgia: M79.7

## 2020-07-04 HISTORY — DX: Anxiety disorder, unspecified: F41.9

## 2020-07-04 HISTORY — DX: Headache, unspecified: R51.9

## 2020-07-04 HISTORY — DX: Acute pyelonephritis: N10

## 2020-07-04 HISTORY — DX: Sleep apnea, unspecified: G47.30

## 2020-07-04 HISTORY — DX: Gastro-esophageal reflux disease without esophagitis: K21.9

## 2020-07-04 HISTORY — DX: Personal history of urinary calculi: Z87.442

## 2020-07-04 LAB — URINALYSIS, ROUTINE W REFLEX MICROSCOPIC
Bilirubin Urine: NEGATIVE
Glucose, UA: NEGATIVE mg/dL
Ketones, ur: NEGATIVE mg/dL
Nitrite: POSITIVE — AB
Protein, ur: 30 mg/dL — AB
Specific Gravity, Urine: 1.011 (ref 1.005–1.030)
WBC, UA: >50 WBC/hpf — ABNORMAL HIGH (ref 0–5)
pH: 6 (ref 5.0–8.0)

## 2020-07-04 LAB — CBC
HCT: 45.1 % (ref 36.0–46.0)
Hemoglobin: 14.7 g/dL (ref 12.0–15.0)
MCH: 31.7 pg (ref 26.0–34.0)
MCHC: 32.6 g/dL (ref 30.0–36.0)
MCV: 97.2 fL (ref 80.0–100.0)
Platelets: 233 10*3/uL (ref 150–400)
RBC: 4.64 MIL/uL (ref 3.87–5.11)
RDW: 13.6 % (ref 11.5–15.5)
WBC: 9 10*3/uL (ref 4.0–10.5)
nRBC: 0 % (ref 0.0–0.2)

## 2020-07-04 LAB — BASIC METABOLIC PANEL
Anion gap: 7 (ref 5–15)
BUN: 7 mg/dL (ref 6–20)
CO2: 26 mmol/L (ref 22–32)
Calcium: 9.1 mg/dL (ref 8.9–10.3)
Chloride: 107 mmol/L (ref 98–111)
Creatinine, Ser: 1.1 mg/dL — ABNORMAL HIGH (ref 0.44–1.00)
GFR, Estimated: 59 mL/min — ABNORMAL LOW (ref >60)
Glucose, Bld: 106 mg/dL — ABNORMAL HIGH (ref 70–99)
Potassium: 3.9 mmol/L (ref 3.5–5.1)
Sodium: 140 mmol/L (ref 135–145)

## 2020-07-04 LAB — LACTIC ACID, PLASMA: Lactic Acid, Venous: 1.1 mmol/L (ref 0.5–1.9)

## 2020-07-04 LAB — HEPATIC FUNCTION PANEL
ALT: 15 U/L (ref 0–44)
AST: 18 U/L (ref 15–41)
Albumin: 3.3 g/dL — ABNORMAL LOW (ref 3.5–5.0)
Alkaline Phosphatase: 64 U/L (ref 38–126)
Bilirubin, Direct: 0.2 mg/dL (ref 0.0–0.2)
Indirect Bilirubin: 0.9 mg/dL (ref 0.3–0.9)
Total Bilirubin: 1.1 mg/dL (ref 0.3–1.2)
Total Protein: 6.4 g/dL — ABNORMAL LOW (ref 6.5–8.1)

## 2020-07-04 LAB — RESP PANEL BY RT-PCR (FLU A&B, COVID) ARPGX2
Influenza A by PCR: NEGATIVE
Influenza B by PCR: NEGATIVE
SARS Coronavirus 2 by RT PCR: NEGATIVE

## 2020-07-04 LAB — I-STAT BETA HCG BLOOD, ED (MC, WL, AP ONLY): I-stat hCG, quantitative: 8.2 m[IU]/mL — ABNORMAL HIGH (ref <5)

## 2020-07-04 LAB — LIPASE, BLOOD: Lipase: 28 U/L (ref 11–51)

## 2020-07-04 IMAGING — CT CT RENAL STONE PROTOCOL
2 of 4 series · 16 of 46 positions shown, 18 images · non-contrast
Comparison: [DATE]

CLINICAL DATA: Right flank pain

EXAM:
CT ABDOMEN AND PELVIS WITHOUT CONTRAST
TECHNIQUE: Multidetector CT imaging of the abdomen and pelvis was performed
following the standard protocol without oral or IV contrast.

[Series 3: stone study 5.0 i30f 2 · axial · 0.88mm/px · z∈[+764,+1184]mm · 13 of 93 slices shown, 15 images]
[im 5/93  soft-tissue]
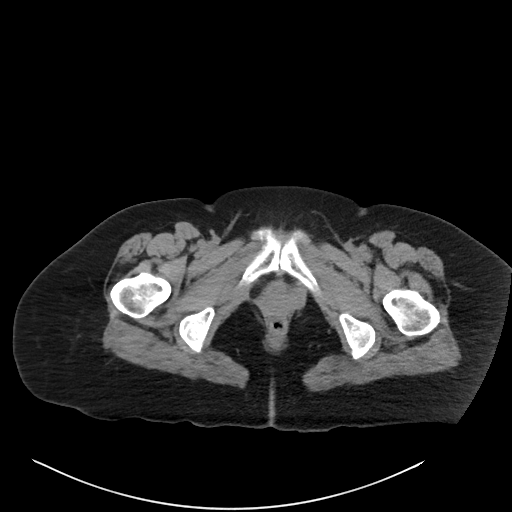
[im 5/93  bone]
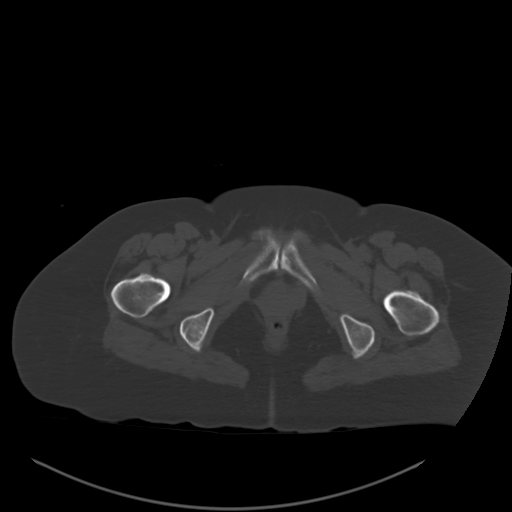
[im 13/93  soft-tissue]
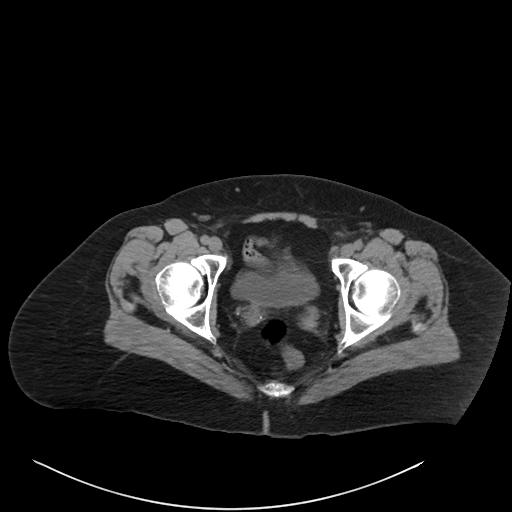
[im 21/93  soft-tissue]
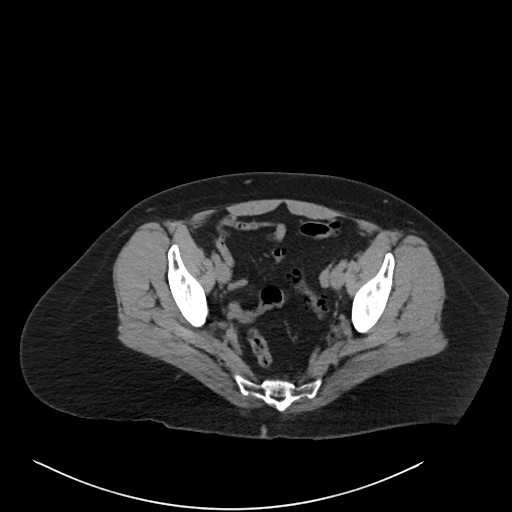
[im 25/93  soft-tissue]
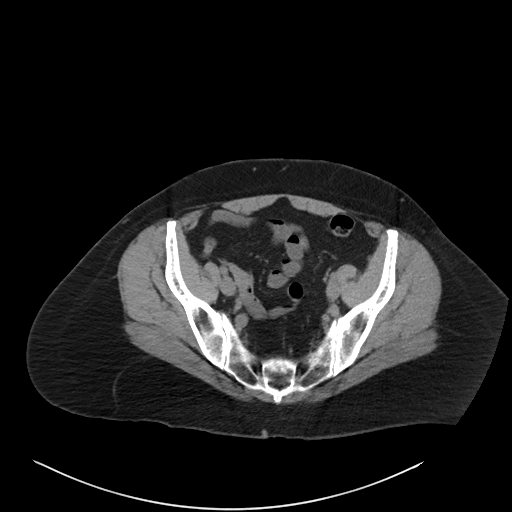
[im 33/93  soft-tissue]
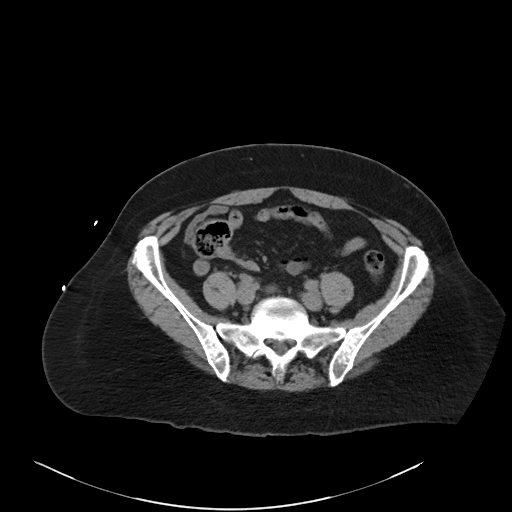
[im 41/93  soft-tissue]
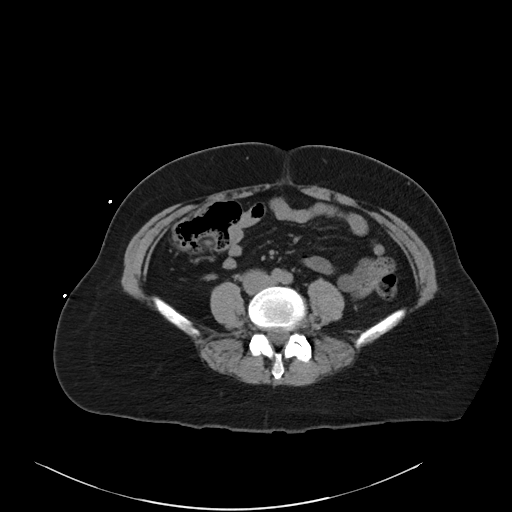
[im 49/93  soft-tissue]
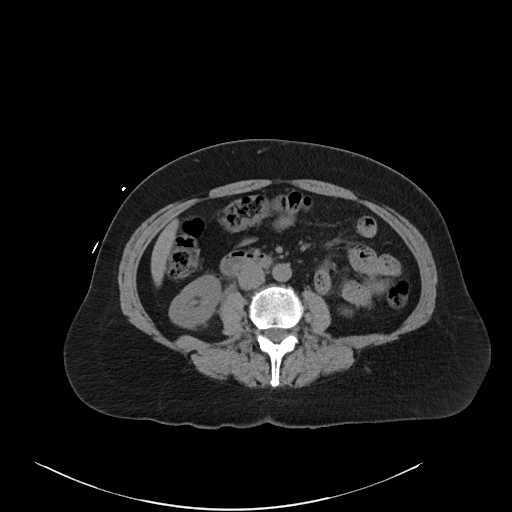
[im 53/93  soft-tissue]
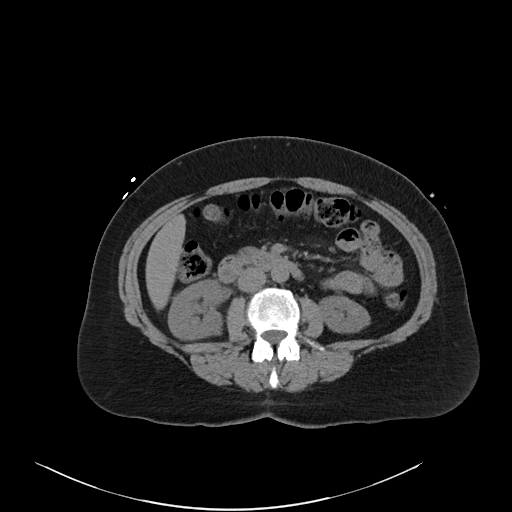
[im 61/93  soft-tissue]
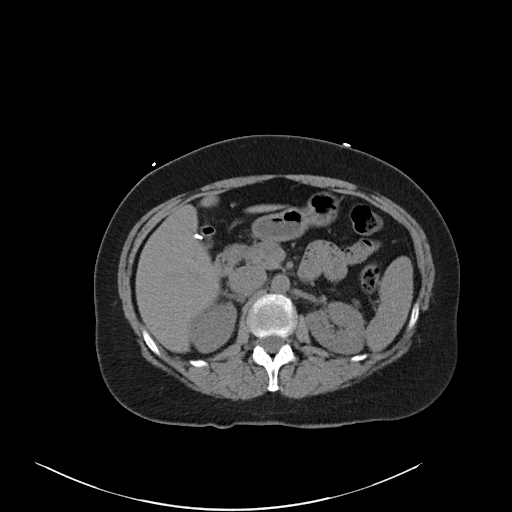
[im 61/93  bone]
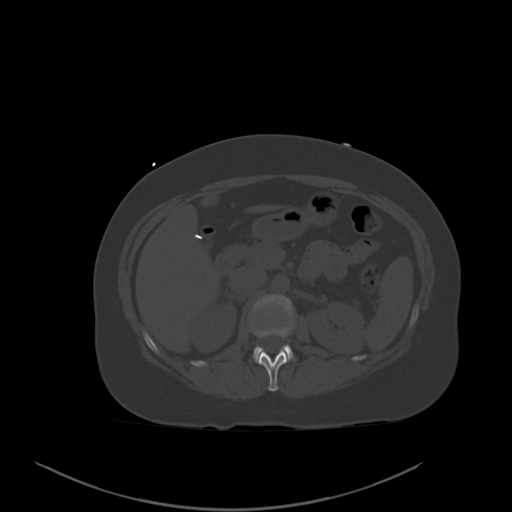
[im 69/93  soft-tissue]
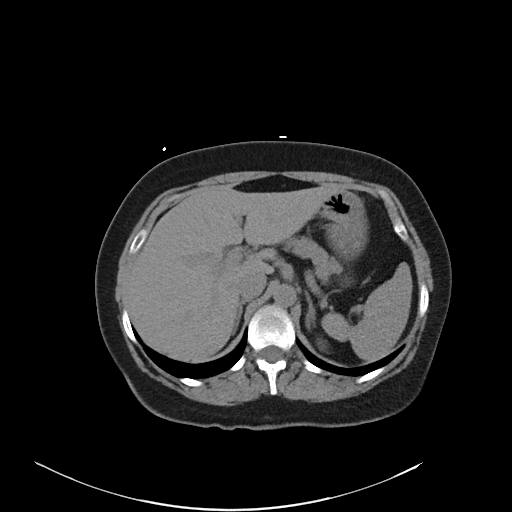
[im 73/93  soft-tissue]
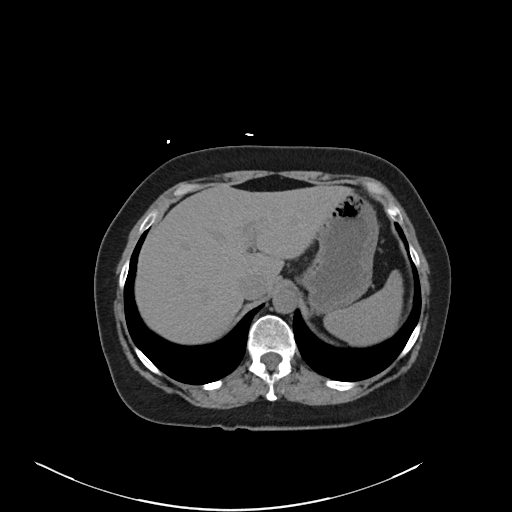
[im 81/93  soft-tissue]
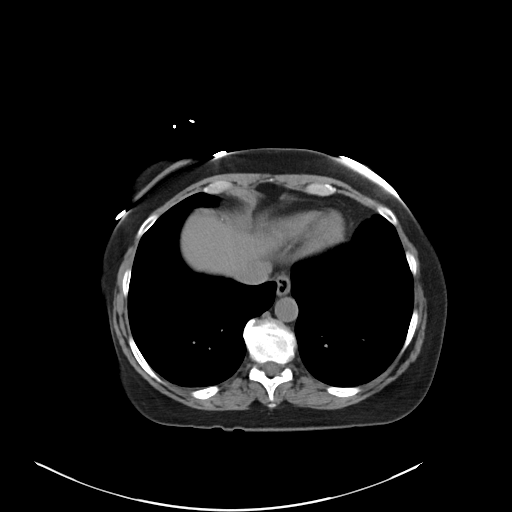
[im 89/93  soft-tissue]
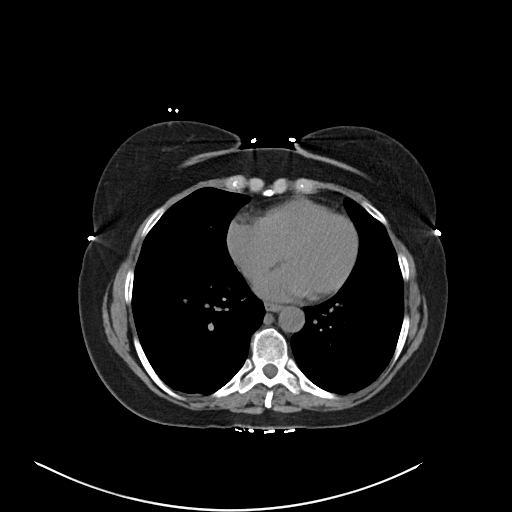

[Series 6: coronal soft tissue · coronal · 0.90mm/px · 3 of 101 slices shown]
[im 34/101  soft-tissue]
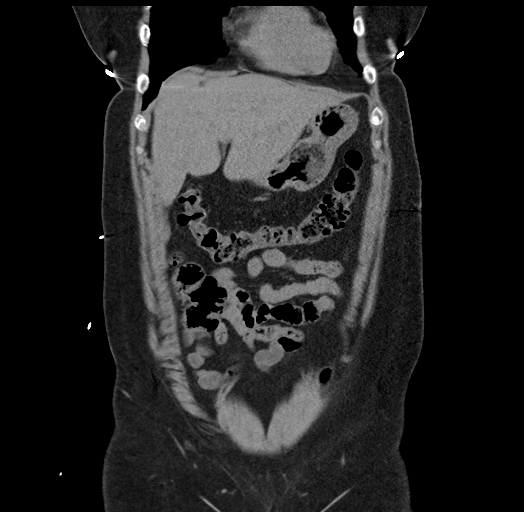
[im 45/101  soft-tissue]
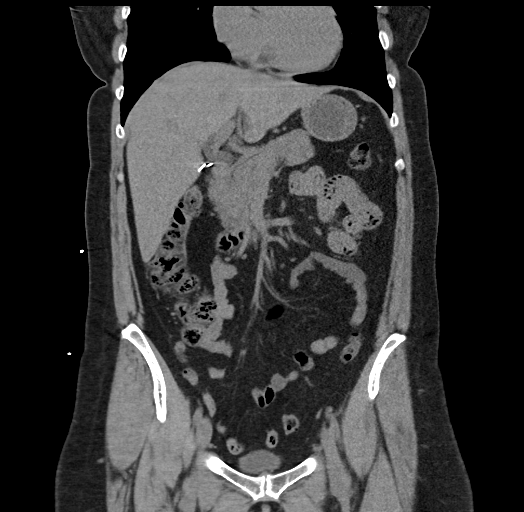
[im 56/101  soft-tissue]
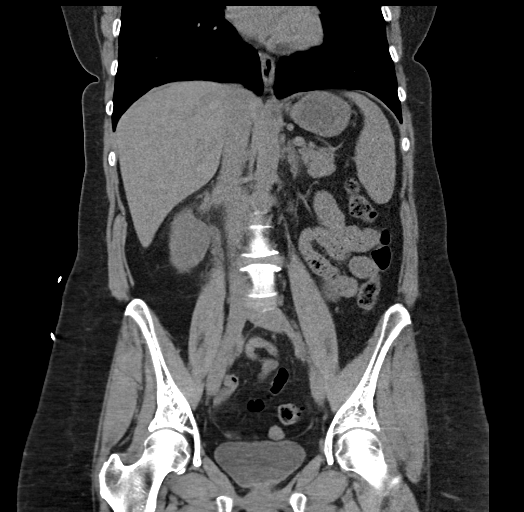

[16 of 46 positions shown; findings below may reference images not displayed]

FINDINGS: Lower chest: Lung bases are clear.

Hepatobiliary: There is an 8 mm cyst in the right lobe of the liver
anteriorly near the junction with the left lobe. No other focal
liver lesions are appreciable on this noncontrast enhanced study.
Gallbladder is absent. There is no appreciable biliary duct
dilatation.

Pancreas: There is no pancreatic mass or inflammatory focus.

Spleen: No splenic lesions are evident.

Adrenals/Urinary Tract: Adrenals bilaterally appear normal. No
evident renal mass on either side. There is mild hydronephrosis on
the right. There is no appreciable hydronephrosis on the left. There
is no evident renal calculus on either side. No ureteral calculus
evident on either side. Note that there is an apparent small
phlebolith immediately adjacent to the distal right ureter. Urinary
bladder is midline with wall thickness within normal limits.

Stomach/Bowel: No appreciable bowel wall or mesenteric thickening
noted. There are sigmoid diverticula without diverticulitis. There
is no evident bowel obstruction. The terminal ileum appears normal.
No periappendiceal region inflammatory change. No free air or portal
venous air.

Vascular/Lymphatic: There is no abdominal aortic aneurysm. No
vascular lesions evident. Incidental note is made of a retroaortic
left renal vein, an anatomic variant. No adenopathy is appreciable
in the abdomen or pelvis.

Reproductive: The uterus is absent.  No adnexal mass evident.

Other: No abscess or ascites is appreciable in the abdomen or
pelvis.

Musculoskeletal: There are no blastic or lytic bone lesions. No
intramuscular or abdominal wall lesions are evident.
IMPRESSION: 1. Mild hydronephrosis on the right without evident renal or
ureteral calculus. Question recent calculus passage on the right.
Pyelonephritis could present in this manner and is a differential
consideration. No appreciable renal abscess on noncontrast enhanced
study.

2. Sigmoid diverticula without diverticulitis. No bowel wall
thickening or bowel obstruction. No abscess in the abdomen or
pelvis. No periappendiceal region inflammatory change.

3.  Gallbladder absent.  Uterus absent.

## 2020-07-04 MED ORDER — ACETAMINOPHEN 650 MG RE SUPP: 650.0000 mg | Freq: Four times a day (QID) | RECTAL | Status: DC | PRN

## 2020-07-04 MED ORDER — ONDANSETRON HCL 4 MG/2ML IJ SOLN: 4.0000 mg | Freq: Four times a day (QID) | INTRAMUSCULAR | Status: DC | PRN

## 2020-07-04 MED ORDER — ONDANSETRON HCL 4 MG PO TABS
4.0000 mg | ORAL_TABLET | Freq: Four times a day (QID) | ORAL | Status: DC | PRN
  Administered 2020-07-05: 4 mg via ORAL
  Filled 2020-07-04: qty 1

## 2020-07-04 MED ORDER — HYDROMORPHONE HCL 1 MG/ML IJ SOLN
0.5000 mg | Freq: Once | INTRAMUSCULAR | Status: AC
  Administered 2020-07-04: 0.5 mg via INTRAVENOUS
  Filled 2020-07-04: qty 1

## 2020-07-04 MED ORDER — HYDROMORPHONE HCL 1 MG/ML IJ SOLN
1.0000 mg | Freq: Once | INTRAMUSCULAR | Status: AC
  Administered 2020-07-04: 1 mg via INTRAVENOUS
  Filled 2020-07-04: qty 1

## 2020-07-04 MED ORDER — DIAZEPAM 5 MG PO TABS
5.0000 mg | ORAL_TABLET | Freq: Four times a day (QID) | ORAL | Status: DC | PRN
  Administered 2020-07-06: 5 mg via ORAL
  Filled 2020-07-04: qty 1

## 2020-07-04 MED ORDER — LACTATED RINGERS IV SOLN: INTRAVENOUS | Status: DC

## 2020-07-04 MED ORDER — KETOROLAC TROMETHAMINE 30 MG/ML IJ SOLN
30.0000 mg | Freq: Four times a day (QID) | INTRAMUSCULAR | Status: DC | PRN
Start: 2020-07-04 — End: 2020-07-07
  Administered 2020-07-04 – 2020-07-06 (×5): 30 mg via INTRAVENOUS
  Filled 2020-07-04 (×8): qty 1

## 2020-07-04 MED ORDER — HYDROCODONE-ACETAMINOPHEN 5-325 MG PO TABS: 1.0000 | ORAL_TABLET | Freq: Four times a day (QID) | ORAL | 0 refills | Status: DC | PRN

## 2020-07-04 MED ORDER — SODIUM CHLORIDE 0.9 % IV SOLN: INTRAVENOUS | Status: DC

## 2020-07-04 MED ORDER — ACETAMINOPHEN 325 MG PO TABS
650.0000 mg | ORAL_TABLET | Freq: Four times a day (QID) | ORAL | Status: DC | PRN
  Administered 2020-07-04 – 2020-07-05 (×4): 650 mg via ORAL
  Filled 2020-07-04 (×4): qty 2

## 2020-07-04 MED ORDER — ONDANSETRON HCL 4 MG/2ML IJ SOLN
4.0000 mg | Freq: Once | INTRAMUSCULAR | Status: AC
  Administered 2020-07-04: 4 mg via INTRAVENOUS
  Filled 2020-07-04: qty 2

## 2020-07-04 MED ORDER — ENOXAPARIN SODIUM 40 MG/0.4ML ~~LOC~~ SOLN
40.0000 mg | SUBCUTANEOUS | Status: DC
  Administered 2020-07-04 – 2020-07-06 (×3): 40 mg via SUBCUTANEOUS
  Filled 2020-07-04 (×3): qty 0.4

## 2020-07-04 MED ORDER — KETOROLAC TROMETHAMINE 30 MG/ML IJ SOLN
30.0000 mg | Freq: Once | INTRAMUSCULAR | Status: AC
  Administered 2020-07-04: 30 mg via INTRAVENOUS
  Filled 2020-07-04: qty 1

## 2020-07-04 MED ORDER — AMOXICILLIN-POT CLAVULANATE 875-125 MG PO TABS: 1.0000 | ORAL_TABLET | Freq: Two times a day (BID) | ORAL | 0 refills | Status: DC

## 2020-07-04 MED ORDER — LACTATED RINGERS IV BOLUS
1000.0000 mL | Freq: Once | INTRAVENOUS | Status: AC
  Administered 2020-07-04: 1000 mL via INTRAVENOUS

## 2020-07-04 MED ORDER — SODIUM CHLORIDE 0.9 % IV SOLN
2.0000 g | INTRAVENOUS | Status: DC
  Administered 2020-07-05 – 2020-07-06 (×2): 2 g via INTRAVENOUS
  Filled 2020-07-04 (×2): qty 20

## 2020-07-04 MED ORDER — ONDANSETRON 4 MG PO TBDP: 4.0000 mg | ORAL_TABLET | ORAL | 0 refills | Status: DC | PRN

## 2020-07-04 MED ORDER — ACETAMINOPHEN 500 MG PO TABS
1000.0000 mg | ORAL_TABLET | Freq: Once | ORAL | Status: AC
  Administered 2020-07-04: 1000 mg via ORAL
  Filled 2020-07-04: qty 2

## 2020-07-04 MED ORDER — SODIUM CHLORIDE 0.9 % IV SOLN
2.0000 g | Freq: Once | INTRAVENOUS | Status: AC
  Administered 2020-07-04: 2 g via INTRAVENOUS
  Filled 2020-07-04: qty 20

## 2020-07-04 MED ORDER — IBUPROFEN 600 MG PO TABS: 600.0000 mg | ORAL_TABLET | Freq: Four times a day (QID) | ORAL | 0 refills | Status: DC | PRN

## 2020-07-04 MED ORDER — PAROXETINE HCL 10 MG PO TABS: 10.0000 mg | ORAL_TABLET | Freq: Every day | ORAL | Status: DC

## 2020-07-04 NOTE — ED Notes (Signed)
Introduced self to pt. POC discussed. Pt reports 4/10 pain to right side and back. No changes in status reported. Will continue to monitor.

## 2020-07-04 NOTE — H&P (Signed)
History and Physical    Barbara Edwards TDV:[id number] DOB: 14-Jan-1961 DOA: 07/04/2020  PCP: Patient, No Pcp Per (Inactive) (Confirm with patient/family/NH records and if not entered, this has to be entered at [facility] point of entry) Patient coming from: Home  I have personally briefly reviewed patient's old medical records in [facility]  Chief Complaint: Right flank pain  HPI: Barbara Edwards is a 60 y.o. female with medical history significant of recurrent kidney stones, anxiety/depression, presented with sudden onset of right flank and groin pain, fever and dysuria.  Patient woke up this morning feeling nauseous and vomited x1 and then started to feel cramping like right flank pain radiating to right groin area, also experienced multiple episodes of chills at home, became constant and came to ED.  Spiking fever 101.9 in ED.  Denied any diarrhea, no cough. ED Course: WBC is 9.0, creatinine 1.1, renal ultrasound showed mild hydronephrosis on the right side, no stones detected.  Review of Systems: As per HPI otherwise 14 point review of systems negative.    History reviewed. No pertinent past medical history.  History reviewed. No pertinent surgical history.   has no history on file for tobacco use, alcohol use, and drug use.  No Known Allergies  No family history on file.  Unacceptable: Noncontributory, unremarkable, or negative. Acceptable: (example)Family history negative for heart disease  Prior to Admission medications   Medication Sig Start Date End Date Taking? Authorizing Provider  amoxicillin-clavulanate (AUGMENTIN) 875-125 MG tablet Take 1 tablet by mouth 2 (two) times daily. One po bid x 7 days 07/04/20  Yes Pfeiffer, Lebron Conners, MD  HYDROcodone-acetaminophen (NORCO/VICODIN) 5-325 MG tablet Take 1-2 tablets by mouth every 6 (six) hours as needed for moderate pain or severe pain. 07/04/20  Yes Arby Barrette, MD  ibuprofen (ADVIL) 600 MG tablet Take 1 tablet (600 mg total)  by mouth every 6 (six) hours as needed. 07/04/20  Yes Arby Barrette, MD  ondansetron (ZOFRAN ODT) 4 MG disintegrating tablet Take 1 tablet (4 mg total) by mouth every 4 (four) hours as needed for nausea or vomiting. 07/04/20  Yes Pfeiffer, Lebron Conners, MD  cephALEXin (KEFLEX) 500 MG capsule Take 1 capsule (500 mg total) by mouth 4 (four) times daily. 11/20/16   Roxy Horseman, PA-C  diazepam (VALIUM) 5 MG tablet Take 5 mg by mouth every 6 (six) hours as needed for anxiety.    [provider]  HYDROcodone-acetaminophen (NORCO/VICODIN) 5-325 MG tablet Take 1-2 tablets by mouth every 6 (six) hours as needed. 11/20/16   Roxy Horseman, PA-C  ondansetron (ZOFRAN) 4 MG tablet Take 1 tablet (4 mg total) by mouth every 6 (six) hours. 11/20/16   Roxy Horseman, PA-C  PARoxetine (PAXIL) 10 MG tablet Take 10 mg by mouth daily.    [provider]    Physical Exam: Vitals:   07/04/20 1320 07/04/20 1406 07/04/20 1553 07/04/20 1600  BP: 116/69 123/76 129/82 122/69  Pulse: 72 77 79 83  Resp: 16 18 18 17   Temp:  99.4 F (37.4 C) (!) 101.9 F (38.8 C)   TempSrc:  Oral Oral   SpO2: 97% 98% 97% 96%    Constitutional: NAD, calm, comfortable Vitals:   07/04/20 1320 07/04/20 1406 07/04/20 1553 07/04/20 1600  BP: 116/69 123/76 129/82 122/69  Pulse: 72 77 79 83  Resp: 16 18 18 17   Temp:  99.4 F (37.4 C) (!) 101.9 F (38.8 C)   TempSrc:  Oral Oral   SpO2: 97% 98% 97% 96%  Eyes: PERRL, lids and conjunctivae normal ENMT: Mucous membranes are moist. Posterior pharynx clear of any exudate or lesions.Normal dentition.  Neck: normal, supple, no masses, no thyromegaly Respiratory: clear to auscultation bilaterally, no wheezing, no crackles. Normal respiratory effort. No accessory muscle use.  Cardiovascular: Regular rate and rhythm, no murmurs / rubs / gallops. No extremity edema. 2+ pedal pulses. No carotid bruits.  Abdomen: Right CVA tenderness, no masses palpated. No hepatosplenomegaly.  Bowel sounds positive.  Musculoskeletal: no clubbing / cyanosis. No joint deformity upper and lower extremities. Good ROM, no contractures. Normal muscle tone.  Skin: no rashes, lesions, ulcers. No induration Neurologic: CN 2-12 grossly intact. Sensation intact, DTR normal. Strength 5/5 in all 4.  Psychiatric: Normal judgment and insight. Alert and oriented x 3. Normal mood.     Labs on Admission: I have personally reviewed following labs and imaging studies  CBC: Recent Labs  Lab 07/04/20 1123  WBC 9.0  HGB 14.7  HCT 45.1  MCV 97.2  PLT 233   Basic Metabolic Panel: Recent Labs  Lab 07/04/20 1123  NA 140  K 3.9  CL 107  CO2 26  GLUCOSE 106*  BUN 7  CREATININE 1.10*  CALCIUM 9.1   GFR: CrCl cannot be calculated (Unknown ideal weight.). Liver Function Tests: Recent Labs  Lab 07/04/20 1215  AST 18  ALT 15  ALKPHOS 64  BILITOT 1.1  PROT 6.4*  ALBUMIN 3.3*   Recent Labs  Lab 07/04/20 1215  LIPASE 28   No results for input(s): AMMONIA in the last 168 hours. Coagulation Profile: No results for input(s): INR, PROTIME in the last 168 hours. Cardiac Enzymes: No results for input(s): CKTOTAL, CKMB, CKMBINDEX, TROPONINI in the last 168 hours. BNP (last 3 results) No results for input(s): PROBNP in the last 8760 hours. HbA1C: No results for input(s): HGBA1C in the last 72 hours. CBG: No results for input(s): GLUCAP in the last 168 hours. Lipid Profile: No results for input(s): CHOL, HDL, LDLCALC, TRIG, CHOLHDL, LDLDIRECT in the last 72 hours. Thyroid Function Tests: No results for input(s): TSH, T4TOTAL, FREET4, T3FREE, THYROIDAB in the last 72 hours. Anemia Panel: No results for input(s): VITAMINB12, FOLATE, FERRITIN, TIBC, IRON, RETICCTPCT in the last 72 hours. Urine analysis:    Component Value Date/Time   COLORURINE YELLOW 07/04/2020 1134   APPEARANCEUR CLOUDY (A) 07/04/2020 1134   LABSPEC 1.011 07/04/2020 1134   PHURINE 6.0 07/04/2020 1134   GLUCOSEU  NEGATIVE 07/04/2020 1134   HGBUR MODERATE (A) 07/04/2020 1134   BILIRUBINUR NEGATIVE 07/04/2020 1134   KETONESUR NEGATIVE 07/04/2020 1134   PROTEINUR 30 (A) 07/04/2020 1134   NITRITE POSITIVE (A) 07/04/2020 1134   LEUKOCYTESUR LARGE (A) 07/04/2020 1134    Radiological Exams on Admission: CT Renal Stone Study  Result Date: 07/04/2020 CLINICAL DATA:  Right flank pain EXAM: CT ABDOMEN AND PELVIS WITHOUT CONTRAST TECHNIQUE: Multidetector CT imaging of the abdomen and pelvis was performed following the standard protocol without oral or IV contrast. COMPARISON:  February 21, 2016 FINDINGS: Lower chest: Lung bases are clear. Hepatobiliary: There is an 8 mm cyst in the right lobe of the liver anteriorly near the junction with the left lobe. No other focal liver lesions are appreciable on this noncontrast enhanced study. Gallbladder is absent. There is no appreciable biliary duct dilatation. Pancreas: There is no pancreatic mass or inflammatory focus. Spleen: No splenic lesions are evident. Adrenals/Urinary Tract: Adrenals bilaterally appear normal. No evident renal mass on either side. There is mild hydronephrosis on  the right. There is no appreciable hydronephrosis on the left. There is no evident renal calculus on either side. No ureteral calculus evident on either side. Note that there is an apparent small phlebolith immediately adjacent to the distal right ureter. Urinary bladder is midline with wall thickness within normal limits. Stomach/Bowel: No appreciable bowel wall or mesenteric thickening noted. There are sigmoid diverticula without diverticulitis. There is no evident bowel obstruction. The terminal ileum appears normal. No periappendiceal region inflammatory change. No free air or portal venous air. Vascular/Lymphatic: There is no abdominal aortic aneurysm. No vascular lesions evident. Incidental note is made of a retroaortic left renal vein, an anatomic variant. No adenopathy is appreciable in the  abdomen or pelvis. Reproductive: The uterus is absent.  No adnexal mass evident. Other: No abscess or ascites is appreciable in the abdomen or pelvis. Musculoskeletal: There are no blastic or lytic bone lesions. No intramuscular or abdominal wall lesions are evident. IMPRESSION: 1. Mild hydronephrosis on the right without evident renal or ureteral calculus. Question recent calculus passage on the right. Pyelonephritis could present in this manner and is a differential consideration. No appreciable renal abscess on noncontrast enhanced study. 2. Sigmoid diverticula without diverticulitis. No bowel wall thickening or bowel obstruction. No abscess in the abdomen or pelvis. No periappendiceal region inflammatory change. 3.  Gallbladder absent.  Uterus absent. Electronically Signed   By: Bretta Bang III M.D.   On: 07/04/2020 13:30    EKG: None  Assessment/Plan Active Problems:   Acute pyelonephritis   Pyelonephritis  (please populate well all problems here in Problem List. (For example, if patient is on BP meds at home and you resume or decide to hold them, it is a problem that needs to be her. Same for CAD, COPD, HLD and so on)  Acute pyelonephritis -Secondary to a passing kidney stone likely, given kidney ultrasound result. -Continue IV hydration and ceftriaxone while waiting for urine culture. -Pain medication and Zofran as needed.  Anxiety/depression -Continue SSRI.  Fever -Secondary to acute pyelonephritis, no signs of sepsis.  DVT prophylaxis: Lovenox Code Status: Full Code Family Communication: None at bedside Disposition Plan: Expect more than 2 midnight hospital stay to treat pyelonephritis. Consults called: Urology Admission status: Medsurg admit   Emeline General MD [facility] Pager [phone]  07/04/2020, 4:41 PM

## 2020-07-04 NOTE — ED Triage Notes (Signed)
Patient complains of right groin and right flank pain x 2 days. States that she has had vomiting with same. Also reports urinary urgency

## 2020-07-04 NOTE — ED Provider Notes (Addendum)
[facility] EMERGENCY DEPARTMENT Provider Note   CSN: [id number] Arrival date & time: 07/04/20  1106     History No chief complaint on file.   Barbara Edwards is a 60 y.o. female.  HPI Patient reports she started getting a lot of pain in her right lower abdomen day before yesterday.  Started in the evening.  Several hours later she had one large episode of diarrhea and then 1 large episode of vomiting.  She did not go on to have recurrent episodes of vomiting or diarrhea.  Pain however persisted.  She spent most of yesterday in bed and was in quite a bit of pain.  She does continue to feel nauseated.  She has not had a fever that she is aware of.  Suspect that she might of had a kidney stone.  She reports has had one in the past.  She reports pain is somewhat radiating around from her back.  She denies she been experiencing pain or burning with urination.  Patient reports that she has had a cholecystectomy, appendectomy and hysterectomy with ovaries remaining.    History reviewed. No pertinent past medical history.  There are no problems to display for this patient.   History reviewed. No pertinent surgical history.   OB History   No obstetric history on file.     No family history on file.     Home Medications Prior to Admission medications   Medication Sig Start Date End Date Taking? Authorizing Provider  amoxicillin-clavulanate (AUGMENTIN) 875-125 MG tablet Take 1 tablet by mouth 2 (two) times daily. One po bid x 7 days 07/04/20  Yes Emireth Cockerham, Lebron Conners, MD  HYDROcodone-acetaminophen (NORCO/VICODIN) 5-325 MG tablet Take 1-2 tablets by mouth every 6 (six) hours as needed for moderate pain or severe pain. 07/04/20  Yes Arby Barrette, MD  ibuprofen (ADVIL) 600 MG tablet Take 1 tablet (600 mg total) by mouth every 6 (six) hours as needed. 07/04/20  Yes Arby Barrette, MD  ondansetron (ZOFRAN ODT) 4 MG disintegrating tablet Take 1 tablet (4 mg total) by mouth every  4 (four) hours as needed for nausea or vomiting. 07/04/20  Yes Coleby Yett, Lebron Conners, MD  cephALEXin (KEFLEX) 500 MG capsule Take 1 capsule (500 mg total) by mouth 4 (four) times daily. 11/20/16   Roxy Horseman, PA-C  diazepam (VALIUM) 5 MG tablet Take 5 mg by mouth every 6 (six) hours as needed for anxiety.    [provider]  HYDROcodone-acetaminophen (NORCO/VICODIN) 5-325 MG tablet Take 1-2 tablets by mouth every 6 (six) hours as needed. 11/20/16   Roxy Horseman, PA-C  ondansetron (ZOFRAN) 4 MG tablet Take 1 tablet (4 mg total) by mouth every 6 (six) hours. 11/20/16   Roxy Horseman, PA-C  PARoxetine (PAXIL) 10 MG tablet Take 10 mg by mouth daily.    [provider]    Allergies    Patient has no known allergies.  Review of Systems   Review of Systems 10 systems reviewed and negative except as per HPI Physical Exam Updated Vital Signs BP 129/82 (BP Location: Right Arm)   Pulse 79   Temp (!) 101.9 F (38.8 C) (Oral)   Resp 18   SpO2 97%   Physical Exam Constitutional:      Appearance: She is well-developed.     Comments: Patient is nontoxic.  She does appear very uncomfortable.  No respiratory distress.  HENT:     Head: Normocephalic and atraumatic.  Eyes:     Extraocular Movements:  Extraocular movements intact.  Cardiovascular:     Rate and Rhythm: Normal rate and regular rhythm.     Heart sounds: Normal heart sounds.  Pulmonary:     Effort: Pulmonary effort is normal.     Breath sounds: Normal breath sounds.  Abdominal:     General: Bowel sounds are normal. There is no distension.     Palpations: Abdomen is soft.     Tenderness: There is abdominal tenderness.     Comments: Positive for right CVA tenderness.  No rashes or lesions.  Right lower quadrant very tender to palpation.  Suprapubic tender to palpation.  No appreciable masses.  Musculoskeletal:        General: Normal range of motion.     Cervical back: Neck supple.  Skin:    General: Skin is  warm and dry.  Neurological:     Mental Status: She is alert and oriented to person, place, and time.     GCS: GCS eye subscore is 4. GCS verbal subscore is 5. GCS motor subscore is 6.     Coordination: Coordination normal.  Psychiatric:        Mood and Affect: Mood normal.     ED Results / Procedures / Treatments   Labs (all labs ordered are listed, but only abnormal results are displayed) Labs Reviewed  URINALYSIS, ROUTINE W REFLEX MICROSCOPIC - Abnormal; Notable for the following components:      Result Value   APPearance CLOUDY (*)    Hgb urine dipstick MODERATE (*)    Protein, ur 30 (*)    Nitrite POSITIVE (*)    Leukocytes,Ua LARGE (*)    WBC, UA >50 (*)    Bacteria, UA FEW (*)    All other components within normal limits  BASIC METABOLIC PANEL - Abnormal; Notable for the following components:   Glucose, Bld 106 (*)    Creatinine, Ser 1.10 (*)    GFR, Estimated 59 (*)    All other components within normal limits  HEPATIC FUNCTION PANEL - Abnormal; Notable for the following components:   Total Protein 6.4 (*)    Albumin 3.3 (*)    All other components within normal limits  I-STAT BETA HCG BLOOD, ED ([facility], [facility], [facility] ONLY) - Abnormal; Notable for the following components:   I-stat hCG, quantitative 8.2 (*)    All other components within normal limits  URINE CULTURE  CBC  LIPASE, BLOOD  LACTIC ACID, PLASMA    EKG None  Radiology CT Renal Stone Study  Result Date: 07/04/2020 CLINICAL DATA:  Right flank pain EXAM: CT ABDOMEN AND PELVIS WITHOUT CONTRAST TECHNIQUE: Multidetector CT imaging of the abdomen and pelvis was performed following the standard protocol without oral or IV contrast. COMPARISON:  February 21, 2016 FINDINGS: Lower chest: Lung bases are clear. Hepatobiliary: There is an 8 mm cyst in the right lobe of the liver anteriorly near the junction with the left lobe. No other focal liver lesions are appreciable on this noncontrast enhanced study. Gallbladder is  absent. There is no appreciable biliary duct dilatation. Pancreas: There is no pancreatic mass or inflammatory focus. Spleen: No splenic lesions are evident. Adrenals/Urinary Tract: Adrenals bilaterally appear normal. No evident renal mass on either side. There is mild hydronephrosis on the right. There is no appreciable hydronephrosis on the left. There is no evident renal calculus on either side. No ureteral calculus evident on either side. Note that there is an apparent small phlebolith immediately adjacent to the distal right ureter.  Urinary bladder is midline with wall thickness within normal limits. Stomach/Bowel: No appreciable bowel wall or mesenteric thickening noted. There are sigmoid diverticula without diverticulitis. There is no evident bowel obstruction. The terminal ileum appears normal. No periappendiceal region inflammatory change. No free air or portal venous air. Vascular/Lymphatic: There is no abdominal aortic aneurysm. No vascular lesions evident. Incidental note is made of a retroaortic left renal vein, an anatomic variant. No adenopathy is appreciable in the abdomen or pelvis. Reproductive: The uterus is absent.  No adnexal mass evident. Other: No abscess or ascites is appreciable in the abdomen or pelvis. Musculoskeletal: There are no blastic or lytic bone lesions. No intramuscular or abdominal wall lesions are evident. IMPRESSION: 1. Mild hydronephrosis on the right without evident renal or ureteral calculus. Question recent calculus passage on the right. Pyelonephritis could present in this manner and is a differential consideration. No appreciable renal abscess on noncontrast enhanced study. 2. Sigmoid diverticula without diverticulitis. No bowel wall thickening or bowel obstruction. No abscess in the abdomen or pelvis. No periappendiceal region inflammatory change. 3.  Gallbladder absent.  Uterus absent. Electronically Signed   By: Bretta Bang III M.D.   On: 07/04/2020 13:30     Procedures Procedures   Medications Ordered in ED Medications  lactated ringers infusion ( Intravenous New Bag/Given 07/04/20 1404)  acetaminophen (TYLENOL) tablet 1,000 mg (has no administration in time range)  lactated ringers bolus 1,000 mL (0 mLs Intravenous Stopped 07/04/20 1406)  HYDROmorphone (DILAUDID) injection 0.5 mg (0.5 mg Intravenous Given 07/04/20 1249)  ondansetron (ZOFRAN) injection 4 mg (4 mg Intravenous Given 07/04/20 1317)  cefTRIAXone (ROCEPHIN) 2 g in sodium chloride 0.9 % 100 mL IVPB (0 g Intravenous Stopped 07/04/20 1445)  ketorolac (TORADOL) 30 MG/ML injection 30 mg (30 mg Intravenous Given 07/04/20 1550)  HYDROmorphone (DILAUDID) injection 1 mg (1 mg Intravenous Given 07/04/20 1550)    ED Course  I have reviewed the triage vital signs and the nursing notes.  Pertinent labs & imaging results that were available during my care of the patient were reviewed by me and considered in my medical decision making (see chart for details).    MDM Rules/Calculators/A&P                         Consult: Reviewed with Dr. Alvester Morin.  He is also reviewed CT scan and does not see any obstructive pathology would require intervention at this time.  He is with plan of antibiotics and discharged with follow-up and return precautions.  Presents as outlined above.  She does have a urinalysis grossly positive for UTI.  He has a lot of right lower quadrant pain and right flank pain.  CT does not show any stone or frank obstruction.  Patient has not had any vomiting since day before yesterday.  Her vital signs are stable.  At this time will plan for antibiotic therapy, hydration and pain control.  Careful return precautions reviewed.  Anticipated discharge in patient with pyelonephritis for home antibiotic therapy.  However, patient is temperature went up to 101.9 with shaking chills.  Pain rebounded significantly requiring additional Dilaudid for pain control.  Patient is nauseated.  At this time  will plan for admission for pyelonephritis with intractable pain and fever. Final Clinical Impression(s) / ED Diagnoses Final diagnoses:  Pyelonephritis  Fever, unspecified fever cause  Intractable pain    Rx / DC Orders ED Discharge Orders         Ordered  amoxicillin-clavulanate (AUGMENTIN) 875-125 MG tablet  2 times daily        07/04/20 1432    ibuprofen (ADVIL) 600 MG tablet  Every 6 hours PRN        07/04/20 1432    HYDROcodone-acetaminophen (NORCO/VICODIN) 5-325 MG tablet  Every 6 hours PRN        07/04/20 1432    ondansetron (ZOFRAN ODT) 4 MG disintegrating tablet  Every 4 hours PRN        07/04/20 1432           Arby Barrette, MD 07/04/20 1539    Arby Barrette, MD 07/04/20 1600

## 2020-07-04 NOTE — Discharge Instructions (Addendum)
1.  You have been given a dose of Rocephin in the emergency department.  You may start your Augmentin as prescribed tomorrow. 2.  Take ibuprofen every 8 hours for pain control.  Take 1-2 Vicodin in addition to this if you need additional pain control.  Take a Zofran tablet every 4 hours if needed for nausea. 3.  At this time you have findings of a kidney infection.  Return to the emergency department if you are developing high fevers, vomiting and cannot tolerate fluids or your medications, worsening pain, generally feeling very ill or weak.  Schedule a follow-up appointment with your family doctor for recheck within the next 2 to 4 days.  If your symptoms are not dramatically improving in the next 48-72 hours, you may also need a follow-up appointment with the urologist.  Contact information is included in your discharge instructions.

## 2020-07-05 ENCOUNTER — Encounter (HOSPITAL_COMMUNITY): Payer: Self-pay | Admitting: Internal Medicine

## 2020-07-05 DIAGNOSIS — N10 Acute tubulo-interstitial nephritis: Secondary | ICD-10-CM

## 2020-07-05 LAB — CBC
HCT: 39.6 % (ref 36.0–46.0)
Hemoglobin: 12.5 g/dL (ref 12.0–15.0)
MCH: 32 pg (ref 26.0–34.0)
MCHC: 31.6 g/dL (ref 30.0–36.0)
MCV: 101.3 fL — ABNORMAL HIGH (ref 80.0–100.0)
Platelets: 159 10*3/uL (ref 150–400)
RBC: 3.91 MIL/uL (ref 3.87–5.11)
RDW: 13.6 % (ref 11.5–15.5)
WBC: 7 10*3/uL (ref 4.0–10.5)
nRBC: 0 % (ref 0.0–0.2)

## 2020-07-05 LAB — BASIC METABOLIC PANEL
Anion gap: 7 (ref 5–15)
BUN: 7 mg/dL (ref 6–20)
CO2: 24 mmol/L (ref 22–32)
Calcium: 8.1 mg/dL — ABNORMAL LOW (ref 8.9–10.3)
Chloride: 111 mmol/L (ref 98–111)
Creatinine, Ser: 0.96 mg/dL (ref 0.44–1.00)
GFR, Estimated: >60 mL/min (ref >60)
Glucose, Bld: 73 mg/dL (ref 70–99)
Potassium: 3.5 mmol/L (ref 3.5–5.1)
Sodium: 142 mmol/L (ref 135–145)

## 2020-07-05 LAB — URINE CULTURE

## 2020-07-05 LAB — HIV ANTIBODY (ROUTINE TESTING W REFLEX): HIV Screen 4th Generation wRfx: NONREACTIVE

## 2020-07-05 MED ORDER — IBUPROFEN 200 MG PO TABS
400.0000 mg | ORAL_TABLET | Freq: Four times a day (QID) | ORAL | Status: DC | PRN
  Administered 2020-07-05 – 2020-07-06 (×3): 400 mg via ORAL
  Filled 2020-07-05 (×4): qty 2

## 2020-07-05 MED ORDER — ESCITALOPRAM OXALATE 10 MG PO TABS
10.0000 mg | ORAL_TABLET | Freq: Every day | ORAL | Status: DC
  Administered 2020-07-05 – 2020-07-07 (×3): 10 mg via ORAL
  Filled 2020-07-05 (×3): qty 1

## 2020-07-05 MED ORDER — POLYETHYLENE GLYCOL 3350 17 G PO PACK
17.0000 g | PACK | Freq: Every day | ORAL | Status: DC
  Administered 2020-07-05: 17 g via ORAL
  Filled 2020-07-05 (×2): qty 1

## 2020-07-05 MED ORDER — SENNOSIDES-DOCUSATE SODIUM 8.6-50 MG PO TABS: 1.0000 | ORAL_TABLET | Freq: Two times a day (BID) | ORAL | Status: DC

## 2020-07-05 NOTE — Progress Notes (Signed)
PROGRESS NOTE    Barbara Edwards  MGQ:[id number] DOB: 1960/09/18 DOA: 07/04/2020 PCP: Patient, No Pcp Per (Inactive)    No chief complaint on file.   Brief Narrative:  Barbara Edwards is a 60 y.o. female with medical history significant of recurrent kidney stones, anxiety/depression, presented with sudden onset of right flank and groin pain, fever and dysuria.  Subjective:  Fever subsided ,c/o to have back pain, radiating to right groin, pain is severe, received several doses of iv toradol  No appetite, does not want to eat Reports chronic constipation   Assessment & Plan:   Active Problems:   Acute pyelonephritis   Pyelonephritis   Acute UTI/ pyelonephritis, possible passed a kidney stone with mild possible right-sided hydronephrosis -Continue IV antibiotics, awaiting for urine culture, she continues have significant pain, poor appetite, continue hydration, as needed analgesic and antiemetics -Case discussed with urology Dr. Laverle Patter over the phone, patient currently does not have active kidney stone, treat as pyelonephritis, outpatient follow-up with urology if patient is interested in stone prevention measures   Anxiety depression -Continue home medication  Constipation Check TSH Agreed to MiraLAX        Unresulted Labs (From admission, onward)          Start     Ordered   07/06/20 0500  TSH  Tomorrow morning,   R       Question:  Specimen collection method  Answer:  Lab=Lab collect   07/05/20 1204   07/06/20 0500  CBC with Differential/Platelet  Tomorrow morning,   R       Question:  Specimen collection method  Answer:  Lab=Lab collect   07/05/20 1204   07/06/20 0500  Basic metabolic panel  Tomorrow morning,   R       Question:  Specimen collection method  Answer:  Lab=Lab collect   07/05/20 1204   07/05/20 0500  HIV Antibody (routine testing w rflx)  Once,   STAT        07/05/20 0500            DVT prophylaxis: enoxaparin (LOVENOX) injection 40 mg  Start: 07/04/20 1800   Code Status:full Family Communication: Patient Disposition:   Status is: Inpatient    Dispo: The patient is from: Home              Anticipated d/c is to: Home              Anticipated d/c date is: 24 to 48 hours pending culture result and symptom improvement                 Consultants:   Case discussed with urology Dr. Laverle Patter  Procedures:   None  Antimicrobials:   Anti-infectives (From admission, onward)   Start     Dose/Rate Route Frequency Ordered Stop   07/05/20 1400  cefTRIAXone (ROCEPHIN) 2 g in sodium chloride 0.9 % 100 mL IVPB        2 g 200 mL/hr over 30 Minutes Intravenous Every 24 hours 07/04/20 1645     07/04/20 1415  cefTRIAXone (ROCEPHIN) 2 g in sodium chloride 0.9 % 100 mL IVPB        2 g 200 mL/hr over 30 Minutes Intravenous  Once 07/04/20 1406 07/04/20 1445   07/04/20 0000  amoxicillin-clavulanate (AUGMENTIN) 875-125 MG tablet        1 tablet Oral 2 times daily 07/04/20 1432            Objective:  Vitals:   07/04/20 2045 07/05/20 0015 07/05/20 0405 07/05/20 0757  BP: 103/64 108/69 (!) 126/91 97/69  Pulse: 68 62 60 75  Resp:  16 16 16   Temp:  98.4 F (36.9 C) 97.9 F (36.6 C) 98.3 F (36.8 C)  TempSrc:  Oral Oral Oral  SpO2:  97% 98% 97%    Intake/Output Summary (Last 24 hours) at 07/05/2020 1220 Last data filed at 07/05/2020 0340 Gross per 24 hour  Intake 2985.55 ml  Output 300 ml  Net 2685.55 ml   There were no vitals filed for this visit.  Examination:  General exam: calm, NAD Respiratory system: Clear to auscultation. Respiratory effort normal. Cardiovascular system: S1 & S2 heard, RRR. No JVD, no murmur, No pedal edema. Gastrointestinal system: Abdomen is nondistended, soft and nontender. Normal bowel sounds heard. Central nervous system: Alert and oriented. No focal neurological deficits. Extremities: Symmetric 5 x 5 power. Skin: No rashes, lesions or ulcers Psychiatry: Judgement and insight appear  normal. Mood & affect appropriate.     Data Reviewed: I have personally reviewed following labs and imaging studies  CBC: Recent Labs  Lab 07/04/20 1123 07/05/20 0456  WBC 9.0 7.0  HGB 14.7 12.5  HCT 45.1 39.6  MCV 97.2 101.3*  PLT 233 159    Basic Metabolic Panel: Recent Labs  Lab 07/04/20 1123 07/05/20 0456  NA 140 142  K 3.9 3.5  CL 107 111  CO2 26 24  GLUCOSE 106* 73  BUN 7 7  CREATININE 1.10* 0.96  CALCIUM 9.1 8.1*    GFR: CrCl cannot be calculated (Unknown ideal weight.).  Liver Function Tests: Recent Labs  Lab 07/04/20 1215  AST 18  ALT 15  ALKPHOS 64  BILITOT 1.1  PROT 6.4*  ALBUMIN 3.3*    CBG: No results for input(s): GLUCAP in the last 168 hours.   Recent Results (from the past 240 hour(s))  Urine C&S     Status: Abnormal (Preliminary result)   Collection Time: 07/04/20 11:34 AM   Specimen: Urine, Clean Catch  Result Value Ref Range Status   Specimen Description URINE, CLEAN CATCH  Final   Special Requests NONE  Final   Culture (A)  Final    >=100,000 COLONIES/mL GRAM NEGATIVE RODS SUSCEPTIBILITIES TO FOLLOW Performed at [facility], [address], [place], [place]    Report Status PENDING  Incomplete  Resp Panel by RT-PCR (Flu A&B, Covid) Nasopharyngeal Swab     Status: None   Collection Time: 07/04/20  6:34 PM   Specimen: Nasopharyngeal Swab; Nasopharyngeal(NP) swabs in vial transport medium  Result Value Ref Range Status   SARS Coronavirus 2 by RT PCR NEGATIVE NEGATIVE Final    Comment: (NOTE) SARS-CoV-2 target nucleic acids are NOT DETECTED.  The SARS-CoV-2 RNA is generally detectable in upper respiratory specimens during the acute phase of infection. The lowest concentration of SARS-CoV-2 viral copies this assay can detect is 138 copies/mL. A negative result does not preclude SARS-Cov-2 infection and should not be used as the sole basis for treatment or other patient management decisions. A negative result  may occur with  improper specimen collection/handling, submission of specimen other than nasopharyngeal swab, presence of viral mutation(s) within the areas targeted by this assay, and inadequate number of viral copies(<138 copies/mL). A negative result must be combined with clinical observations, patient history, and epidemiological information. The expected result is Negative.  Fact Sheet for Patients:  07/06/20  Fact Sheet for Healthcare Providers:  BloggerCourse.com  This test is no t yet approved or cleared by the [place] and  has been authorized for detection and/or diagnosis of SARS-CoV-2 by FDA under an Emergency Use Authorization (EUA). This EUA will remain  in effect (meaning this test can be used) for the duration of the COVID-19 declaration under Section 564(b)(1) of the Act, 21 U.S.C.section 360bbb-3(b)(1), unless the authorization is terminated  or revoked sooner.       Influenza A by PCR NEGATIVE NEGATIVE Final   Influenza B by PCR NEGATIVE NEGATIVE Final    Comment: (NOTE) The Xpert Xpress SARS-CoV-2/FLU/RSV plus assay is intended as an aid in the diagnosis of influenza from Nasopharyngeal swab specimens and should not be used as a sole basis for treatment. Nasal washings and aspirates are unacceptable for Xpert Xpress SARS-CoV-2/FLU/RSV testing.  Fact Sheet for Patients: BloggerCourse.com  Fact Sheet for Healthcare Providers: SeriousBroker.it  This test is not yet approved or cleared by the [place] FDA and has been authorized for detection and/or diagnosis of SARS-CoV-2 by FDA under an Emergency Use Authorization (EUA). This EUA will remain in effect (meaning this test can be used) for the duration of the COVID-19 declaration under Section 564(b)(1) of the Act, 21 U.S.C. section 360bbb-3(b)(1), unless the authorization is terminated  or revoked.  Performed at [facility], [address], [place], [place] [address]          Radiology Studies: CT Renal Stone Study  Result Date: 07/04/2020 CLINICAL DATA:  Right flank pain EXAM: CT ABDOMEN AND PELVIS WITHOUT CONTRAST TECHNIQUE: Multidetector CT imaging of the abdomen and pelvis was performed following the standard protocol without oral or IV contrast. COMPARISON:  February 21, 2016 FINDINGS: Lower chest: Lung bases are clear. Hepatobiliary: There is an 8 mm cyst in the right lobe of the liver anteriorly near the junction with the left lobe. No other focal liver lesions are appreciable on this noncontrast enhanced study. Gallbladder is absent. There is no appreciable biliary duct dilatation. Pancreas: There is no pancreatic mass or inflammatory focus. Spleen: No splenic lesions are evident. Adrenals/Urinary Tract: Adrenals bilaterally appear normal. No evident renal mass on either side. There is mild hydronephrosis on the right. There is no appreciable hydronephrosis on the left. There is no evident renal calculus on either side. No ureteral calculus evident on either side. Note that there is an apparent small phlebolith immediately adjacent to the distal right ureter. Urinary bladder is midline with wall thickness within normal limits. Stomach/Bowel: No appreciable bowel wall or mesenteric thickening noted. There are sigmoid diverticula without diverticulitis. There is no evident bowel obstruction. The terminal ileum appears normal. No periappendiceal region inflammatory change. No free air or portal venous air. Vascular/Lymphatic: There is no abdominal aortic aneurysm. No vascular lesions evident. Incidental note is made of a retroaortic left renal vein, an anatomic variant. No adenopathy is appreciable in the abdomen or pelvis. Reproductive: The uterus is absent.  No adnexal mass evident. Other: No abscess or ascites is appreciable in the abdomen or pelvis. Musculoskeletal:  There are no blastic or lytic bone lesions. No intramuscular or abdominal wall lesions are evident. IMPRESSION: 1. Mild hydronephrosis on the right without evident renal or ureteral calculus. Question recent calculus passage on the right. Pyelonephritis could present in this manner and is a differential consideration. No appreciable renal abscess on noncontrast enhanced study. 2. Sigmoid diverticula without diverticulitis. No bowel wall thickening or bowel obstruction. No abscess in the abdomen or pelvis. No periappendiceal region  inflammatory change. 3.  Gallbladder absent.  Uterus absent. Electronically Signed   By: Bretta Bang III M.D.   On: 07/04/2020 13:30        Scheduled Meds: . enoxaparin (LOVENOX) injection  40 mg Subcutaneous Q24H  . escitalopram  10 mg Oral Daily  . polyethylene glycol  17 g Oral Daily   Continuous Infusions: . sodium chloride 150 mL/hr at 07/05/20 0340  . cefTRIAXone (ROCEPHIN)  IV    . lactated ringers Stopped (07/04/20 1653)     LOS: 1 day   Time spent: Greater than 50% of this time was spent in counseling, explanation of diagnosis, planning of further management, and coordination of care.   Voice Recognition Reubin Milan dictation system was used to create this note, attempts have been made to correct errors. Please contact the author with questions and/or clarifications.   Albertine Grates, MD PhD FACP [facility]  Available via Epic secure chat 7am-7pm for nonurgent issues Please page for urgent issues To page the attending provider between 7A-7P or the covering provider during after hours 7P-7A, please log into the web site [email or web address] and access using universal [place] password for that web site. If you do not have the password, please call the hospital operator.    07/05/2020, 12:20 PM

## 2020-07-05 NOTE — Plan of Care (Signed)
  Problem: Clinical Measurements: Goal: Will remain free from infection Outcome: Progressing Goal: Diagnostic test results will improve Outcome: Progressing   

## 2020-07-05 NOTE — Plan of Care (Signed)

## 2020-07-06 LAB — BASIC METABOLIC PANEL
Anion gap: 8 (ref 5–15)
BUN: <5 mg/dL — ABNORMAL LOW (ref 6–20)
CO2: 22 mmol/L (ref 22–32)
Calcium: 8.4 mg/dL — ABNORMAL LOW (ref 8.9–10.3)
Chloride: 111 mmol/L (ref 98–111)
Creatinine, Ser: 0.9 mg/dL (ref 0.44–1.00)
GFR, Estimated: >60 mL/min (ref >60)
Glucose, Bld: 94 mg/dL (ref 70–99)
Potassium: 3.7 mmol/L (ref 3.5–5.1)
Sodium: 141 mmol/L (ref 135–145)

## 2020-07-06 LAB — URINE CULTURE: Culture: >=100000 — AB

## 2020-07-06 LAB — CBC WITH DIFFERENTIAL/PLATELET
Abs Immature Granulocytes: 0.02 10*3/uL (ref 0.00–0.07)
Basophils Absolute: 0 10*3/uL (ref 0.0–0.1)
Basophils Relative: 1 %
Eosinophils Absolute: 0.2 10*3/uL (ref 0.0–0.5)
Eosinophils Relative: 3 %
HCT: 37.9 % (ref 36.0–46.0)
Hemoglobin: 12.3 g/dL (ref 12.0–15.0)
Immature Granulocytes: 0 %
Lymphocytes Relative: 19 %
Lymphs Abs: 1.2 10*3/uL (ref 0.7–4.0)
MCH: 32 pg (ref 26.0–34.0)
MCHC: 32.5 g/dL (ref 30.0–36.0)
MCV: 98.7 fL (ref 80.0–100.0)
Monocytes Absolute: 0.8 10*3/uL (ref 0.1–1.0)
Monocytes Relative: 13 %
Neutro Abs: 4 10*3/uL (ref 1.7–7.7)
Neutrophils Relative %: 64 %
Platelets: 175 10*3/uL (ref 150–400)
RBC: 3.84 MIL/uL — ABNORMAL LOW (ref 3.87–5.11)
RDW: 13.2 % (ref 11.5–15.5)
WBC: 6.2 10*3/uL (ref 4.0–10.5)
nRBC: 0 % (ref 0.0–0.2)

## 2020-07-06 LAB — TSH: TSH: 2.035 u[IU]/mL (ref 0.350–4.500)

## 2020-07-06 NOTE — Plan of Care (Signed)

## 2020-07-06 NOTE — Plan of Care (Signed)

## 2020-07-06 NOTE — Plan of Care (Signed)
?  Problem: Clinical Measurements: ?Goal: Ability to maintain clinical measurements within normal limits will improve ?Outcome: Progressing ?Goal: Will remain free from infection ?Outcome: Progressing ?Goal: Diagnostic test results will improve ?Outcome: Progressing ?  ?

## 2020-07-06 NOTE — Progress Notes (Signed)
PROGRESS NOTE    Barbara Edwards  IEP:[id number] DOB: 03/10/1961 DOA: 07/04/2020 PCP: Patient, No Pcp Per (Inactive)    No chief complaint on file.   Brief Narrative:  Barbara Edwards is a 60 y.o. female with medical history significant of recurrent kidney stones, anxiety/depression, presented with sudden onset of right flank and groin pain, fever and dysuria.  Subjective:  Fever subsided , c/o to have back pain, radiating to lower abdomen No vomiting, does still feeling nauseous, No appetite, does not want to eat Reports chronic constipation on 3/30, today she reports had diarrhea  Assessment & Plan:   Active Problems:   Acute pyelonephritis   Pyelonephritis   Acute UTI/ pyelonephritis, possible passed a kidney stone with mild possible right-sided hydronephrosis -Continue IV antibiotics, urine culture pan sensitive ecoli,  -she continues have significant pain, poor appetite, continue hydration, as needed analgesic and antiemetics, continue iv abx -Case discussed with urology Dr. Laverle Patter over the phone, patient currently does not have active kidney stone, treat as pyelonephritis, outpatient follow-up with urology if patient is interested in stone prevention measures   Anxiety depression -Continue home medication  Constipation  TSH wnl Agreed to MiraLAX, now reports having diarrhea, d/c miralzax        Unresulted Labs (From admission, onward)         None        DVT prophylaxis: enoxaparin (LOVENOX) injection 40 mg Start: 07/04/20 1800   Code Status:full Family Communication: Patient Disposition:   Status is: Inpatient    Dispo: The patient is from: Home              Anticipated d/c is to: Home              Anticipated d/c date is: 24 to 48 hours pending culture result and symptom improvement                 Consultants:   Case discussed with urology Dr. Laverle Patter  Procedures:   None  Antimicrobials:   Anti-infectives (From admission, onward)    Start     Dose/Rate Route Frequency Ordered Stop   07/05/20 1400  cefTRIAXone (ROCEPHIN) 2 g in sodium chloride 0.9 % 100 mL IVPB        2 g 200 mL/hr over 30 Minutes Intravenous Every 24 hours 07/04/20 1645     07/04/20 1415  cefTRIAXone (ROCEPHIN) 2 g in sodium chloride 0.9 % 100 mL IVPB        2 g 200 mL/hr over 30 Minutes Intravenous  Once 07/04/20 1406 07/04/20 1445   07/04/20 0000  amoxicillin-clavulanate (AUGMENTIN) 875-125 MG tablet        1 tablet Oral 2 times daily 07/04/20 1432            Objective: Vitals:   07/05/20 1708 07/05/20 1952 07/06/20 0608 07/06/20 0831  BP:  119/79 124/77 120/79  Pulse:  75 69 72  Resp:  18 18 19   Temp:  99.2 F (37.3 C) 98.8 F (37.1 C) 99 F (37.2 C)  TempSrc:  Oral Oral Oral  SpO2:  97% 97% 96%  Weight: 80.7 kg     Height: 5\' 6"  (1.676 m)       Intake/Output Summary (Last 24 hours) at 07/06/2020 1451 Last data filed at 07/05/2020 1615 Gross per 24 hour  Intake 100 ml  Output --  Net 100 ml   Filed Weights   07/05/20 1708  Weight: 80.7 kg    Examination:  General exam: calm, NAD Respiratory system: Clear to auscultation. Respiratory effort normal. Cardiovascular system: S1 & S2 heard, RRR. No JVD, no murmur, No pedal edema. Gastrointestinal system: Abdomen is nondistended, soft and nontender. Normal bowel sounds heard. + right CVA tenderness  Central nervous system: Alert and oriented. No focal neurological deficits. Extremities: Symmetric 5 x 5 power. Skin: No rashes, lesions or ulcers Psychiatry: Judgement and insight appear normal. Mood & affect appropriate.     Data Reviewed: I have personally reviewed following labs and imaging studies  CBC: Recent Labs  Lab 07/04/20 1123 07/05/20 0456 07/06/20 0434  WBC 9.0 7.0 6.2  NEUTROABS  --   --  4.0  HGB 14.7 12.5 12.3  HCT 45.1 39.6 37.9  MCV 97.2 101.3* 98.7  PLT 233 159 175    Basic Metabolic Panel: Recent Labs  Lab 07/04/20 1123 07/05/20 0456  07/06/20 0434  NA 140 142 141  K 3.9 3.5 3.7  CL 107 111 111  CO2 26 24 22   GLUCOSE 106* 73 94  BUN 7 7 <5*  CREATININE 1.10* 0.96 0.90  CALCIUM 9.1 8.1* 8.4*    GFR: Estimated Creatinine Clearance: 73.9 mL/min (by C-G formula based on SCr of 0.9 mg/dL).  Liver Function Tests: Recent Labs  Lab 07/04/20 1215  AST 18  ALT 15  ALKPHOS 64  BILITOT 1.1  PROT 6.4*  ALBUMIN 3.3*    CBG: No results for input(s): GLUCAP in the last 168 hours.   Recent Results (from the past 240 hour(s))  Urine C&S     Status: Abnormal   Collection Time: 07/04/20 11:34 AM   Specimen: Urine, Clean Catch  Result Value Ref Range Status   Specimen Description URINE, CLEAN CATCH  Final   Special Requests   Final    NONE Performed at [facility], [address], [place], [place]    Culture >=100,000 COLONIES/mL ESCHERICHIA COLI (A)  Final   Report Status 07/06/2020 FINAL  Final   Organism ID, Bacteria ESCHERICHIA COLI (A)  Final      Susceptibility   Escherichia coli - MIC*    AMPICILLIN <=2 SENSITIVE Sensitive     CEFAZOLIN <=4 SENSITIVE Sensitive     CEFEPIME <=0.12 SENSITIVE Sensitive     CEFTRIAXONE <=0.25 SENSITIVE Sensitive     CIPROFLOXACIN <=0.25 SENSITIVE Sensitive     GENTAMICIN <=1 SENSITIVE Sensitive     IMIPENEM <=0.25 SENSITIVE Sensitive     NITROFURANTOIN <=16 SENSITIVE Sensitive     TRIMETH/SULFA <=20 SENSITIVE Sensitive     AMPICILLIN/SULBACTAM <=2 SENSITIVE Sensitive     PIP/TAZO <=4 SENSITIVE Sensitive     * >=100,000 COLONIES/mL ESCHERICHIA COLI  Resp Panel by RT-PCR (Flu A&B, Covid) Nasopharyngeal Swab     Status: None   Collection Time: 07/04/20  6:34 PM   Specimen: Nasopharyngeal Swab; Nasopharyngeal(NP) swabs in vial transport medium  Result Value Ref Range Status   SARS Coronavirus 2 by RT PCR NEGATIVE NEGATIVE Final    Comment: (NOTE) SARS-CoV-2 target nucleic acids are NOT DETECTED.  The SARS-CoV-2 RNA is generally detectable in upper  respiratory specimens during the acute phase of infection. The lowest concentration of SARS-CoV-2 viral copies this assay can detect is 138 copies/mL. A negative result does not preclude SARS-Cov-2 infection and should not be used as the sole basis for treatment or other patient management decisions. A negative result may occur with  improper specimen collection/handling, submission of specimen other than nasopharyngeal swab, presence of viral  mutation(s) within the areas targeted by this assay, and inadequate number of viral copies(<138 copies/mL). A negative result must be combined with clinical observations, patient history, and epidemiological information. The expected result is Negative.  Fact Sheet for Patients:  BloggerCourse.com  Fact Sheet for Healthcare Providers:  SeriousBroker.it  This test is no t yet approved or cleared by the [place] FDA and  has been authorized for detection and/or diagnosis of SARS-CoV-2 by FDA under an Emergency Use Authorization (EUA). This EUA will remain  in effect (meaning this test can be used) for the duration of the COVID-19 declaration under Section 564(b)(1) of the Act, 21 U.S.C.section 360bbb-3(b)(1), unless the authorization is terminated  or revoked sooner.       Influenza A by PCR NEGATIVE NEGATIVE Final   Influenza B by PCR NEGATIVE NEGATIVE Final    Comment: (NOTE) The Xpert Xpress SARS-CoV-2/FLU/RSV plus assay is intended as an aid in the diagnosis of influenza from Nasopharyngeal swab specimens and should not be used as a sole basis for treatment. Nasal washings and aspirates are unacceptable for Xpert Xpress SARS-CoV-2/FLU/RSV testing.  Fact Sheet for Patients: BloggerCourse.com  Fact Sheet for Healthcare Providers: SeriousBroker.it  This test is not yet approved or cleared by the [place] FDA and has been  authorized for detection and/or diagnosis of SARS-CoV-2 by FDA under an Emergency Use Authorization (EUA). This EUA will remain in effect (meaning this test can be used) for the duration of the COVID-19 declaration under Section 564(b)(1) of the Act, 21 U.S.C. section 360bbb-3(b)(1), unless the authorization is terminated or revoked.  Performed at [facility], [address] [place], [place] [address]          Radiology Studies: No results found.      Scheduled Meds: . enoxaparin (LOVENOX) injection  40 mg Subcutaneous Q24H  . escitalopram  10 mg Oral Daily  . polyethylene glycol  17 g Oral Daily   Continuous Infusions: . cefTRIAXone (ROCEPHIN)  IV 2 g (07/06/20 1357)  . lactated ringers 125 mL/hr at 07/06/20 0058     LOS: 2 days   Time spent: Greater than 50% of this time was spent in counseling, explanation of diagnosis, planning of further management, and coordination of care.   Voice Recognition Reubin Milan dictation system was used to create this note, attempts have been made to correct errors. Please contact the author with questions and/or clarifications.   Albertine Grates, MD PhD FACP [facility]  Available via Epic secure chat 7am-7pm for nonurgent issues Please page for urgent issues To page the attending provider between 7A-7P or the covering provider during after hours 7P-7A, please log into the web site [email or web address] and access using universal [place] password for that web site. If you do not have the password, please call the hospital operator.    07/06/2020, 2:51 PM

## 2020-07-07 LAB — BASIC METABOLIC PANEL
Anion gap: 5 (ref 5–15)
BUN: 8 mg/dL (ref 6–20)
CO2: 27 mmol/L (ref 22–32)
Calcium: 8.3 mg/dL — ABNORMAL LOW (ref 8.9–10.3)
Chloride: 109 mmol/L (ref 98–111)
Creatinine, Ser: 0.9 mg/dL (ref 0.44–1.00)
GFR, Estimated: >60 mL/min (ref >60)
Glucose, Bld: 105 mg/dL — ABNORMAL HIGH (ref 70–99)
Potassium: 3.5 mmol/L (ref 3.5–5.1)
Sodium: 141 mmol/L (ref 135–145)

## 2020-07-07 LAB — CBC WITH DIFFERENTIAL/PLATELET
Abs Immature Granulocytes: 0.01 10*3/uL (ref 0.00–0.07)
Basophils Absolute: 0 10*3/uL (ref 0.0–0.1)
Basophils Relative: 1 %
Eosinophils Absolute: 0.2 10*3/uL (ref 0.0–0.5)
Eosinophils Relative: 4 %
HCT: 34.3 % — ABNORMAL LOW (ref 36.0–46.0)
Hemoglobin: 11.2 g/dL — ABNORMAL LOW (ref 12.0–15.0)
Immature Granulocytes: 0 %
Lymphocytes Relative: 39 %
Lymphs Abs: 1.6 10*3/uL (ref 0.7–4.0)
MCH: 31.8 pg (ref 26.0–34.0)
MCHC: 32.7 g/dL (ref 30.0–36.0)
MCV: 97.4 fL (ref 80.0–100.0)
Monocytes Absolute: 0.7 10*3/uL (ref 0.1–1.0)
Monocytes Relative: 16 %
Neutro Abs: 1.7 10*3/uL (ref 1.7–7.7)
Neutrophils Relative %: 40 %
Platelets: 170 10*3/uL (ref 150–400)
RBC: 3.52 MIL/uL — ABNORMAL LOW (ref 3.87–5.11)
RDW: 13.3 % (ref 11.5–15.5)
WBC: 4.2 10*3/uL (ref 4.0–10.5)
nRBC: 0 % (ref 0.0–0.2)

## 2020-07-07 LAB — MAGNESIUM: Magnesium: 1.8 mg/dL (ref 1.7–2.4)

## 2020-07-07 MED ORDER — SACCHAROMYCES BOULARDII 250 MG PO CAPS: 250.0000 mg | ORAL_CAPSULE | Freq: Two times a day (BID) | ORAL | 0 refills | Status: AC

## 2020-07-07 MED ORDER — POLYETHYLENE GLYCOL 3350 17 G PO PACK: 17.0000 g | PACK | Freq: Every day | ORAL | 0 refills | Status: DC

## 2020-07-07 NOTE — Progress Notes (Signed)
Pt was given her AVS discharge summary and went over with her. IV was removed with catheter intact. Pt had no further questions. Waiting on husband to transport her home.

## 2020-07-07 NOTE — Plan of Care (Signed)

## 2020-07-07 NOTE — Discharge Summary (Signed)
Discharge Summary  Barbara Edwards Barbara Edwards DOB: 1961/01/11  PCP: Barbara Kingdom, Edwards  Admit date: 07/04/2020 Discharge date: 07/07/2020  Time spent:   Recommendations for Outpatient Follow-up:  1. F/u with PCP within a week  for hospital discharge follow up, repeat cbc/bmp at follow up 2. F/u with [facility]   Discharge Diagnoses:  Active Hospital Problems   Diagnosis Date Noted  . Acute pyelonephritis 07/04/2020  . Pyelonephritis 07/04/2020    Resolved Hospital Problems  No resolved problems to display.    Discharge Condition: stable  Diet recommendation: regular diet  Filed Weights   07/05/20 1708  Weight: 80.7 kg    History of present illness: (per admitting Edwards Barbara Edwards ) Chief Complaint: Right flank pain  HPI: Barbara Edwards is a 60 y.o. female with medical history significant of recurrent kidney stones, anxiety/depression, presented with sudden onset of right flank and groin pain, fever and dysuria.  Patient woke up this morning feeling nauseous and vomited x1 and then started to feel cramping like right flank pain radiating to right groin area, also experienced multiple episodes of chills at home, became constant and came to ED.  Spiking fever 101.9 in ED.  Denied any diarrhea, no cough. ED Course: WBC is 9.0, creatinine 1.1, renal ultrasound showed mild hydronephrosis on the right side, no stones detected.  Hospital Course:  Active Problems:   Acute pyelonephritis   Pyelonephritis   Acute UTI/ pyelonephritis, possible passed a kidney stone with mild possible right-sided hydronephrosis - urine culture pan sensitive ecoli,  -she received iv hydration, as needed analgesic and antiemetics,  iv abx rocephin, she has improved, less pain , able to eat, she desires to go home, she is discharged on augmentin to finish abx treatment -Case discussed with urology Barbara. Laverle Edwards over the phone, patient currently does not have active kidney  stone, treat as pyelonephritis, outpatient follow-up with urology if patient is interested in stone prevention measures -patient also reports h/o recurrent uti and intermittent urinary retention for which she was seen by a urologist in the past, she is interested in seeing urology here in [place], she is  advised to follow up with [facility]   Anxiety depression -Continue home medication  Constipation  TSH wnl Stool softener     She reports chronic daily headache, reports snores at night, advise her to follow up with pcp to have outpatient sleep study /headache specialist referral if needed, she expressed understanding     Procedures:  none  Consultations:  Phone conversation with Barbara Edwards   Discharge Exam: BP 127/81 (BP Location: Right Arm)   Pulse 61   Temp 97.8 F (36.6 C) (Oral)   Resp 17   Ht 5\' 6"  (1.676 m)   Wt 80.7 kg   SpO2 99%   BMI 28.73 kg/m   General: NAD Cardiovascular: RRR Respiratory: normal respiratory effort    Discharge Instructions You were cared for by a hospitalist during your hospital stay. If you have any questions about your discharge medications or the care you received while you were in the hospital after you are discharged, you can call the unit and asked to speak with the hospitalist on call if the hospitalist that took care of you is not available. Once you are discharged, your primary care physician will handle any further medical issues. Please note that NO REFILLS for any discharge medications will be authorized once you are discharged, as it is imperative that you return to  your primary care physician (or establish a relationship with a primary care physician if you do not have one) for your aftercare needs so that they can reassess your need for medications and monitor your lab values.  Discharge Instructions    Diet general   Complete by: As directed    Increase activity slowly   Complete by: As directed       Allergies as of 07/07/2020      Reactions   Prednisone Hives      Medication List    TAKE these medications   amoxicillin-clavulanate 875-125 MG tablet Commonly known as: Augmentin Take 1 tablet by mouth 2 (two) times daily. One po bid x 7 days   cholecalciferol 25 MCG (1000 UNIT) tablet Commonly known as: VITAMIN D3 Take 1,000 Units by mouth daily.   diazepam 10 MG tablet Commonly known as: VALIUM Take 10 mg by mouth at bedtime as needed for sleep. What changed: Another medication with the same name was removed. Continue taking this medication, and follow the directions you see here.   escitalopram 10 MG tablet Commonly known as: LEXAPRO Take 10 mg by mouth daily.   estradiol 0.5 MG tablet Commonly known as: ESTRACE Take 0.5 mg by mouth daily.   FIBER ADULT GUMMIES PO Take 3 tablets by mouth daily.   HYDROcodone-acetaminophen 5-325 MG tablet Commonly known as: NORCO/VICODIN Take 1-2 tablets by mouth every 6 (six) hours as needed for moderate pain or severe pain.   ibuprofen 200 MG tablet Commonly known as: ADVIL Take 400 mg by mouth every 6 (six) hours as needed for headache or mild pain.   ondansetron 4 MG disintegrating tablet Commonly known as: Zofran ODT Take 1 tablet (4 mg total) by mouth every 4 (four) hours as needed for nausea or vomiting.   polyethylene glycol 17 g packet Commonly known as: MiraLax Take 17 g by mouth daily.   saccharomyces boulardii 250 MG capsule Commonly known as: FLORASTOR Take 1 capsule (250 mg total) by mouth 2 (two) times daily for 14 days.   zinc gluconate 50 MG tablet Take 50 mg by mouth daily.      Allergies  Allergen Reactions  . Prednisone Hives    Follow-up Information    Schedule an appointment as soon as possible for a visit  with [facility].   Contact information: [address] [place] [id number] [phone]       Barbara Edwards Follow up in 1 week(s).    Specialty: Internal Medicine Why: hospital discharge follow up Contact information: [address] [place] [address] [phone]        Barbara Edwards Follow up.   Specialty: Urology Contact information: [address] [place] [phone]                The results of significant diagnostics from this hospitalization (including imaging, microbiology, ancillary and laboratory) are listed below for reference.    Significant Diagnostic Studies: CT Renal Stone Study  Result Date: 07/04/2020 CLINICAL DATA:  Right flank pain EXAM: CT ABDOMEN AND PELVIS WITHOUT CONTRAST TECHNIQUE: Multidetector CT imaging of the abdomen and pelvis was performed following the standard protocol without oral or IV contrast. COMPARISON:  February 21, 2016 FINDINGS: Lower chest: Lung bases are clear. Hepatobiliary: There is an 8 mm cyst in the right lobe of the liver anteriorly near the junction with the left lobe. No other focal liver lesions are appreciable on  this noncontrast enhanced study. Gallbladder is absent. There is no appreciable biliary duct dilatation. Pancreas: There is no pancreatic mass or inflammatory focus. Spleen: No splenic lesions are evident. Adrenals/Urinary Tract: Adrenals bilaterally appear normal. No evident renal mass on either side. There is mild hydronephrosis on the right. There is no appreciable hydronephrosis on the left. There is no evident renal calculus on either side. No ureteral calculus evident on either side. Note that there is an apparent small phlebolith immediately adjacent to the distal right ureter. Urinary bladder is midline with wall thickness within normal limits. Stomach/Bowel: No appreciable bowel wall or mesenteric thickening noted. There are sigmoid diverticula without diverticulitis. There is no evident bowel obstruction. The terminal ileum appears normal. No periappendiceal region inflammatory change. No free air or portal venous air.  Vascular/Lymphatic: There is no abdominal aortic aneurysm. No vascular lesions evident. Incidental note is made of a retroaortic left renal vein, an anatomic variant. No adenopathy is appreciable in the abdomen or pelvis. Reproductive: The uterus is absent.  No adnexal mass evident. Other: No abscess or ascites is appreciable in the abdomen or pelvis. Musculoskeletal: There are no blastic or lytic bone lesions. No intramuscular or abdominal wall lesions are evident. IMPRESSION: 1. Mild hydronephrosis on the right without evident renal or ureteral calculus. Question recent calculus passage on the right. Pyelonephritis could present in this manner and is a differential consideration. No appreciable renal abscess on noncontrast enhanced study. 2. Sigmoid diverticula without diverticulitis. No bowel wall thickening or bowel obstruction. No abscess in the abdomen or pelvis. No periappendiceal region inflammatory change. 3.  Gallbladder absent.  Uterus absent. Electronically Signed   By: Bretta Bang III M.D.   On: 07/04/2020 13:30    Microbiology: Recent Results (from the past 240 hour(s))  Urine C&S     Status: Abnormal   Collection Time: 07/04/20 11:34 AM   Specimen: Urine, Clean Catch  Result Value Ref Range Status   Specimen Description URINE, CLEAN CATCH  Final   Special Requests   Final    NONE Performed at [facility], [address] [place], [place] [address]    Culture >=100,000 COLONIES/mL ESCHERICHIA COLI (A)  Final   Report Status 07/06/2020 FINAL  Final   Organism ID, Bacteria ESCHERICHIA COLI (A)  Final      Susceptibility   Escherichia coli - MIC*    AMPICILLIN <=2 SENSITIVE Sensitive     CEFAZOLIN <=4 SENSITIVE Sensitive     CEFEPIME <=0.12 SENSITIVE Sensitive     CEFTRIAXONE <=0.25 SENSITIVE Sensitive     CIPROFLOXACIN <=0.25 SENSITIVE Sensitive     GENTAMICIN <=1 SENSITIVE Sensitive     IMIPENEM <=0.25 SENSITIVE Sensitive     NITROFURANTOIN <=16 SENSITIVE Sensitive      TRIMETH/SULFA <=20 SENSITIVE Sensitive     AMPICILLIN/SULBACTAM <=2 SENSITIVE Sensitive     PIP/TAZO <=4 SENSITIVE Sensitive     * >=100,000 COLONIES/mL ESCHERICHIA COLI  Resp Panel by RT-PCR (Flu A&B, Covid) Nasopharyngeal Swab     Status: None   Collection Time: 07/04/20  6:34 PM   Specimen: Nasopharyngeal Swab; Nasopharyngeal(NP) swabs in vial transport medium  Result Value Ref Range Status   SARS Coronavirus 2 by RT PCR NEGATIVE NEGATIVE Final    Comment: (NOTE) SARS-CoV-2 target nucleic acids are NOT DETECTED.  The SARS-CoV-2 RNA is generally detectable in upper respiratory specimens during the acute phase of infection. The lowest concentration of SARS-CoV-2 viral copies this assay can detect is 138 copies/mL. A  negative result does not preclude SARS-Cov-2 infection and should not be used as the sole basis for treatment or other patient management decisions. A negative result may occur with  improper specimen collection/handling, submission of specimen other than nasopharyngeal swab, presence of viral mutation(s) within the areas targeted by this assay, and inadequate number of viral copies(<138 copies/mL). A negative result must be combined with clinical observations, patient history, and epidemiological information. The expected result is Negative.  Fact Sheet for Patients:  BloggerCourse.com  Fact Sheet for Healthcare Providers:  SeriousBroker.it  This test is no t yet approved or cleared by the [place] FDA and  has been authorized for detection and/or diagnosis of SARS-CoV-2 by FDA under an Emergency Use Authorization (EUA). This EUA will remain  in effect (meaning this test can be used) for the duration of the COVID-19 declaration under Section 564(b)(1) of the Act, 21 U.S.C.section 360bbb-3(b)(1), unless the authorization is terminated  or revoked sooner.       Influenza A by PCR NEGATIVE NEGATIVE Final    Influenza B by PCR NEGATIVE NEGATIVE Final    Comment: (NOTE) The Xpert Xpress SARS-CoV-2/FLU/RSV plus assay is intended as an aid in the diagnosis of influenza from Nasopharyngeal swab specimens and should not be used as a sole basis for treatment. Nasal washings and aspirates are unacceptable for Xpert Xpress SARS-CoV-2/FLU/RSV testing.  Fact Sheet for Patients: BloggerCourse.com  Fact Sheet for Healthcare Providers: SeriousBroker.it  This test is not yet approved or cleared by the [place] FDA and has been authorized for detection and/or diagnosis of SARS-CoV-2 by FDA under an Emergency Use Authorization (EUA). This EUA will remain in effect (meaning this test can be used) for the duration of the COVID-19 declaration under Section 564(b)(1) of the Act, 21 U.S.C. section 360bbb-3(b)(1), unless the authorization is terminated or revoked.  Performed at [facility], [address], [place], [place] [address]      Labs: Basic Metabolic Panel: Recent Labs  Lab 07/04/20 1123 07/05/20 0456 07/06/20 0434 07/07/20 0338  NA 140 142 141 141  K 3.9 3.5 3.7 3.5  CL 107 111 111 109  CO2 26 24 22 27   GLUCOSE 106* 73 94 105*  BUN 7 7 <5* 8  CREATININE 1.10* 0.96 0.90 0.90  CALCIUM 9.1 8.1* 8.4* 8.3*  MG  --   --   --  1.8   Liver Function Tests: Recent Labs  Lab 07/04/20 1215  AST 18  ALT 15  ALKPHOS 64  BILITOT 1.1  PROT 6.4*  ALBUMIN 3.3*   Recent Labs  Lab 07/04/20 1215  LIPASE 28   No results for input(s): AMMONIA in the last 168 hours. CBC: Recent Labs  Lab 07/04/20 1123 07/05/20 0456 07/06/20 0434 07/07/20 0338  WBC 9.0 7.0 6.2 4.2  NEUTROABS  --   --  4.0 1.7  HGB 14.7 12.5 12.3 11.2*  HCT 45.1 39.6 37.9 34.3*  MCV 97.2 101.3* 98.7 97.4  PLT 233 159 175 170   Cardiac Enzymes: No results for input(s): CKTOTAL, CKMB, CKMBINDEX, TROPONINI in the last 168 hours. BNP: BNP (last 3  results) No results for input(s): BNP in the last 8760 hours.  ProBNP (last 3 results) No results for input(s): PROBNP in the last 8760 hours.  CBG: No results for input(s): GLUCAP in the last 168 hours.     Signed:  09/06/20 MD, PhD, FACP  [facility] 07/07/2020, 9:40 AM

## 2020-08-02 DIAGNOSIS — N3021 Other chronic cystitis with hematuria: Secondary | ICD-10-CM | POA: Diagnosis not present

## 2020-08-02 DIAGNOSIS — N390 Urinary tract infection, site not specified: Secondary | ICD-10-CM | POA: Diagnosis not present

## 2020-08-02 DIAGNOSIS — B951 Streptococcus, group B, as the cause of diseases classified elsewhere: Secondary | ICD-10-CM | POA: Diagnosis not present

## 2020-08-02 DIAGNOSIS — R159 Full incontinence of feces: Secondary | ICD-10-CM | POA: Diagnosis not present

## 2020-10-18 DIAGNOSIS — R309 Painful micturition, unspecified: Secondary | ICD-10-CM | POA: Diagnosis not present

## 2020-10-18 DIAGNOSIS — Z6830 Body mass index (BMI) 30.0-30.9, adult: Secondary | ICD-10-CM | POA: Diagnosis not present

## 2020-10-18 DIAGNOSIS — Z01419 Encounter for gynecological examination (general) (routine) without abnormal findings: Secondary | ICD-10-CM | POA: Diagnosis not present

## 2020-11-02 DIAGNOSIS — Z1329 Encounter for screening for other suspected endocrine disorder: Secondary | ICD-10-CM | POA: Diagnosis not present

## 2020-11-02 DIAGNOSIS — Z13228 Encounter for screening for other metabolic disorders: Secondary | ICD-10-CM | POA: Diagnosis not present

## 2020-11-02 DIAGNOSIS — Z13220 Encounter for screening for lipoid disorders: Secondary | ICD-10-CM | POA: Diagnosis not present

## 2020-11-02 DIAGNOSIS — Z1321 Encounter for screening for nutritional disorder: Secondary | ICD-10-CM | POA: Diagnosis not present

## 2020-11-02 DIAGNOSIS — Z1231 Encounter for screening mammogram for malignant neoplasm of breast: Secondary | ICD-10-CM | POA: Diagnosis not present

## 2020-11-17 DIAGNOSIS — M359 Systemic involvement of connective tissue, unspecified: Secondary | ICD-10-CM | POA: Insufficient documentation

## 2020-11-17 DIAGNOSIS — M797 Fibromyalgia: Secondary | ICD-10-CM

## 2020-11-17 DIAGNOSIS — F419 Anxiety disorder, unspecified: Secondary | ICD-10-CM | POA: Insufficient documentation

## 2020-11-17 DIAGNOSIS — G4700 Insomnia, unspecified: Secondary | ICD-10-CM | POA: Insufficient documentation

## 2020-11-17 HISTORY — DX: Fibromyalgia: M79.7

## 2020-12-05 ENCOUNTER — Ambulatory Visit (INDEPENDENT_AMBULATORY_CARE_PROVIDER_SITE_OTHER): Payer: Medicare Other | Admitting: Neurology

## 2020-12-05 ENCOUNTER — Encounter: Payer: Self-pay | Admitting: Neurology

## 2020-12-05 ENCOUNTER — Emergency Department (HOSPITAL_COMMUNITY)
Admission: EM | Admit: 2020-12-05 | Discharge: 2020-12-05 | Disposition: A | Discharge status: Home / Self Care | Payer: Medicare Other | Attending: Emergency Medicine | Admitting: Emergency Medicine

## 2020-12-05 ENCOUNTER — Emergency Department (HOSPITAL_COMMUNITY): Payer: Medicare Other

## 2020-12-05 ENCOUNTER — Encounter (HOSPITAL_COMMUNITY): Payer: Self-pay

## 2020-12-05 VITALS — BP 120/79 | HR 70 | Ht 64.0 in | Wt 188.5 lb

## 2020-12-05 DIAGNOSIS — E559 Vitamin D deficiency, unspecified: Secondary | ICD-10-CM

## 2020-12-05 DIAGNOSIS — F32A Depression, unspecified: Secondary | ICD-10-CM

## 2020-12-05 DIAGNOSIS — S0990XA Unspecified injury of head, initial encounter: Secondary | ICD-10-CM | POA: Diagnosis not present

## 2020-12-05 DIAGNOSIS — S46911A Strain of unspecified muscle, fascia and tendon at shoulder and upper arm level, right arm, initial encounter: Secondary | ICD-10-CM | POA: Diagnosis not present

## 2020-12-05 DIAGNOSIS — J320 Chronic maxillary sinusitis: Secondary | ICD-10-CM | POA: Diagnosis not present

## 2020-12-05 DIAGNOSIS — J3489 Other specified disorders of nose and nasal sinuses: Secondary | ICD-10-CM | POA: Diagnosis not present

## 2020-12-05 DIAGNOSIS — M5489 Other dorsalgia: Secondary | ICD-10-CM | POA: Diagnosis not present

## 2020-12-05 DIAGNOSIS — G3184 Mild cognitive impairment, so stated: Secondary | ICD-10-CM | POA: Diagnosis not present

## 2020-12-05 DIAGNOSIS — M19011 Primary osteoarthritis, right shoulder: Secondary | ICD-10-CM | POA: Diagnosis not present

## 2020-12-05 DIAGNOSIS — Z041 Encounter for examination and observation following transport accident: Secondary | ICD-10-CM | POA: Diagnosis not present

## 2020-12-05 DIAGNOSIS — S199XXA Unspecified injury of neck, initial encounter: Secondary | ICD-10-CM | POA: Diagnosis not present

## 2020-12-05 DIAGNOSIS — F419 Anxiety disorder, unspecified: Secondary | ICD-10-CM | POA: Diagnosis not present

## 2020-12-05 DIAGNOSIS — G43009 Migraine without aura, not intractable, without status migrainosus: Secondary | ICD-10-CM

## 2020-12-05 DIAGNOSIS — S161XXA Strain of muscle, fascia and tendon at neck level, initial encounter: Secondary | ICD-10-CM | POA: Diagnosis not present

## 2020-12-05 DIAGNOSIS — S46811A Strain of other muscles, fascia and tendons at shoulder and upper arm level, right arm, initial encounter: Secondary | ICD-10-CM | POA: Insufficient documentation

## 2020-12-05 DIAGNOSIS — M47812 Spondylosis without myelopathy or radiculopathy, cervical region: Secondary | ICD-10-CM | POA: Diagnosis not present
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
## 2020-12-05 MED ORDER — OXYCODONE-ACETAMINOPHEN 5-325 MG PO TABS: 1.0000 | ORAL_TABLET | Freq: Four times a day (QID) | ORAL | 0 refills | Status: DC | PRN

## 2020-12-05 MED ORDER — GABAPENTIN 600 MG PO TABS: 600.0000 mg | ORAL_TABLET | Freq: Every evening | ORAL | 6 refills | Status: DC

## 2020-12-05 MED ORDER — METHOCARBAMOL 500 MG PO TABS: 500.0000 mg | ORAL_TABLET | Freq: Three times a day (TID) | ORAL | 0 refills | Status: DC | PRN

## 2020-12-05 MED ORDER — ONDANSETRON HCL 4 MG PO TABS
4.0000 mg | ORAL_TABLET | Freq: Once | ORAL | Status: AC
  Administered 2020-12-05: 4 mg via ORAL
  Filled 2020-12-05: qty 1

## 2020-12-05 MED ORDER — METHOCARBAMOL 500 MG PO TABS
500.0000 mg | ORAL_TABLET | Freq: Once | ORAL | Status: AC
  Administered 2020-12-05: 500 mg via ORAL
  Filled 2020-12-05: qty 1

## 2020-12-05 MED ORDER — ONDANSETRON HCL 4 MG/2ML IJ SOLN: 4.0000 mg | Freq: Once | INTRAMUSCULAR | Status: DC

## 2020-12-05 MED ORDER — OXYCODONE-ACETAMINOPHEN 5-325 MG PO TABS
1.0000 | ORAL_TABLET | Freq: Once | ORAL | Status: AC
Start: 2020-12-05 — End: 2020-12-05
  Administered 2020-12-05: 1 via ORAL
  Filled 2020-12-05: qty 1

## 2020-12-05 NOTE — ED Notes (Signed)
Pt back from MRI 
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
## 2020-12-05 NOTE — Discharge Instructions (Addendum)
You have some bulging disks and some irritated nerves in her neck.  He can follow-up with neurosurgery for this.  You have been given a shoulder sling for the shoulder.  Try and keep the shoulder moving however.  Follow-up with your primary care doctor for continued stability.

## 2020-12-05 NOTE — Patient Instructions (Signed)
Trial of Gabapentin 600 mg for migraine prevention  If unable to tolerate, can cut tablet in half and take nightly  Continue with Tylenol or Ibuprofen if you do have migraine headaches  Referral to psychiatry for management of Anxiety/Depression and PTSD  Return in 6 months

## 2020-12-05 NOTE — ED Notes (Signed)
Pt transported to MRI via w/c.

## 2020-12-05 NOTE — ED Notes (Signed)
MD at bedside. 

## 2020-12-07 ENCOUNTER — Encounter: Payer: Self-pay | Admitting: Nurse Practitioner

## 2020-12-07 ENCOUNTER — Other Ambulatory Visit (INDEPENDENT_AMBULATORY_CARE_PROVIDER_SITE_OTHER): Payer: Medicare Other

## 2020-12-07 ENCOUNTER — Ambulatory Visit (INDEPENDENT_AMBULATORY_CARE_PROVIDER_SITE_OTHER): Payer: Medicare Other | Admitting: Nurse Practitioner

## 2020-12-07 VITALS — BP 114/66 | HR 67 | Ht 64.0 in | Wt 187.0 lb

## 2020-12-07 DIAGNOSIS — R159 Full incontinence of feces: Secondary | ICD-10-CM

## 2020-12-07 DIAGNOSIS — R300 Dysuria: Secondary | ICD-10-CM

## 2020-12-07 LAB — URINALYSIS, ROUTINE W REFLEX MICROSCOPIC
Bilirubin Urine: NEGATIVE
Hgb urine dipstick: NEGATIVE
Ketones, ur: NEGATIVE
Leukocytes,Ua: NEGATIVE
Nitrite: NEGATIVE
Specific Gravity, Urine: 1.02 (ref 1.000–1.030)
Total Protein, Urine: NEGATIVE
Urine Glucose: NEGATIVE
Urobilinogen, UA: 0.2 (ref 0.0–1.0)
pH: 5.5 (ref 5.0–8.0)
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
## 2020-12-07 NOTE — Patient Instructions (Addendum)
If you are age 59 or younger, your body mass index should be between 19-25. Your Body mass index is 32.1 kg/m. If this is out of the aformentioned range listed, please consider follow up with your Primary Care Provider.   The [facility] providers would like to encourage you to use [facility] to communicate with providers for non-urgent requests or questions.  Due to long hold times on the telephone, sending your provider a message by [facility] may be faster and more efficient way to get a response. Please allow 48 business hours for a response.  Please remember that this is for non-urgent requests/questions.  LABS:  A urine test has been ordered for you today. Our lab is located in the basement. Press "B" on the elevator. The lab is located at the first door on the left as you exit the elevator.  HEALTHCARE LAWS AND MY CHART RESULTS: Due to recent changes in healthcare laws, you may see the results of your imaging and laboratory studies on MyChart before your provider has had a chance to review them.   We understand that in some cases there may be results that are confusing or concerning to you. Not all laboratory results come back in the same time frame and the provider may be waiting for multiple results in order to interpret others.  Please give [place] 48 hours in order for your provider to thoroughly review all the results before contacting the office for clarification of your results.   We will obtain a copy of your most recent blood work. We will obtain copies of your most recent colonoscopy and pathology reports for Dr. Lavon Paganini to review.  We have scheduled you a follow up with Dr. Lavon Paganini on 02/05/21 at 8:30.  If needed you can take Pepto Bismol 1 tablet a day to increase anal sphincter tone.  It was great seeing you today! Thank you for entrusting me with your care and choosing [facility].  Arnaldo Natal, CRNP

## 2020-12-08 ENCOUNTER — Telehealth: Payer: Self-pay | Admitting: Nurse Practitioner

## 2020-12-08 NOTE — Telephone Encounter (Signed)
Barbara Edwards, pls contact the patient and cancel her office appointment with Dr. Lavon Paganini as she was previously seen by Dr. Chales Abrahams in 2016. Please reschedule her follow up appointment in 6 to 8 weeks with Dr. Chales Abrahams. thx

## 2020-12-08 NOTE — Telephone Encounter (Signed)
Spoke with patient is requesting be seen by another provider as she said she trouble understanding what he is saying.

## 2020-12-14 ENCOUNTER — Telehealth: Payer: Self-pay | Admitting: Neurology

## 2020-12-14 NOTE — Telephone Encounter (Signed)
Dr. Teresa Coombs placed a psychiatry referral on 12/05/20.

## 2020-12-14 NOTE — Telephone Encounter (Signed)
Pt asking for a call with the name of the physiatrist Dr Teresa Coombs is referring her to so she can know if they are covered by her insurance.

## 2020-12-14 NOTE — Telephone Encounter (Signed)
No referrals have been placed from our office that I am able to see.

## 2020-12-14 NOTE — Telephone Encounter (Signed)
Spoke w/ Marcelino Duster, for some reason referral is visible in Epic for her but not visible on my end. When she pulled referral up it appeared that it was sent to [facility]. I called the office and was told that the referral was most likely rerouted to the [place] or [place] office because the [place] office is not accepting new patients. I called patient to give her an update, and to ask if she had a preference on where she goes because she lives in [place]. She said she is willing to travel a reasonable distance, she just wants to make sure they accept Medicare. I advised that we refer many patients to [facility] in [place], and I know that they take Medicare. Patient was fine with this. I told her that the referral will be sent this afternoon, and I asked her to call us back if she does not hear from them by the end of next week. Patient was appreciative.

## 2020-12-17 NOTE — Progress Notes (Signed)
Agree with above RG 

## 2020-12-19 DIAGNOSIS — R1013 Epigastric pain: Secondary | ICD-10-CM | POA: Diagnosis not present

## 2020-12-19 DIAGNOSIS — M549 Dorsalgia, unspecified: Secondary | ICD-10-CM | POA: Diagnosis not present

## 2020-12-19 DIAGNOSIS — F419 Anxiety disorder, unspecified: Secondary | ICD-10-CM | POA: Diagnosis not present

## 2020-12-19 DIAGNOSIS — Z6830 Body mass index (BMI) 30.0-30.9, adult: Secondary | ICD-10-CM | POA: Diagnosis not present

## 2020-12-21 DIAGNOSIS — S161XXA Strain of muscle, fascia and tendon at neck level, initial encounter: Secondary | ICD-10-CM | POA: Diagnosis not present

## 2020-12-21 DIAGNOSIS — Z6830 Body mass index (BMI) 30.0-30.9, adult: Secondary | ICD-10-CM | POA: Diagnosis not present

## 2020-12-27 NOTE — Telephone Encounter (Signed)
Dr. Lavon Paganini, you were supervising doc day of patient's last appointment, patient wishes to follow up with another provider (previously seen by Dr .Chales Abrahams, see note below), can patient schedule follow up appointment with you?

## 2020-12-27 NOTE — Telephone Encounter (Signed)
Called the patient. No answer. Left a message on the voicemail asking she call and ask for Barbara Edwards. I will help schedule an appointment for her with Dr Lavon Paganini.

## 2020-12-27 NOTE — Telephone Encounter (Signed)
sure

## 2020-12-27 NOTE — Telephone Encounter (Signed)
Called patient. No answer. Left another message.

## 2020-12-29 NOTE — Telephone Encounter (Signed)
Patient returned call. Scheduled OV 11/18 with Dr. Lavon Paganini

## 2021-01-03 DIAGNOSIS — R300 Dysuria: Secondary | ICD-10-CM | POA: Diagnosis not present

## 2021-01-18 DIAGNOSIS — M5412 Radiculopathy, cervical region: Secondary | ICD-10-CM | POA: Diagnosis not present

## 2021-01-18 DIAGNOSIS — S161XXA Strain of muscle, fascia and tendon at neck level, initial encounter: Secondary | ICD-10-CM | POA: Diagnosis not present

## 2021-02-05 ENCOUNTER — Ambulatory Visit: Payer: BC Managed Care – PPO | Admitting: Gastroenterology

## 2021-02-23 ENCOUNTER — Encounter: Payer: Self-pay | Admitting: Gastroenterology

## 2021-02-23 ENCOUNTER — Ambulatory Visit (INDEPENDENT_AMBULATORY_CARE_PROVIDER_SITE_OTHER): Payer: Medicare Other | Admitting: Gastroenterology

## 2021-02-23 ENCOUNTER — Other Ambulatory Visit (INDEPENDENT_AMBULATORY_CARE_PROVIDER_SITE_OTHER): Payer: Medicare Other

## 2021-02-23 VITALS — BP 112/66 | HR 70 | Ht 64.0 in | Wt 185.0 lb

## 2021-02-23 DIAGNOSIS — K921 Melena: Secondary | ICD-10-CM

## 2021-02-23 DIAGNOSIS — R112 Nausea with vomiting, unspecified: Secondary | ICD-10-CM

## 2021-02-23 DIAGNOSIS — R1013 Epigastric pain: Secondary | ICD-10-CM

## 2021-02-23 DIAGNOSIS — Z86010 Personal history of colonic polyps: Secondary | ICD-10-CM

## 2021-02-23 DIAGNOSIS — K5902 Outlet dysfunction constipation: Secondary | ICD-10-CM

## 2021-02-23 LAB — CBC WITH DIFFERENTIAL/PLATELET
Basophils Absolute: 0 10*3/uL (ref 0.0–0.1)
Basophils Relative: 0.8 % (ref 0.0–3.0)
Eosinophils Absolute: 0.2 10*3/uL (ref 0.0–0.7)
Eosinophils Relative: 5.3 % — ABNORMAL HIGH (ref 0.0–5.0)
HCT: 36.9 % (ref 36.0–46.0)
Hemoglobin: 12.2 g/dL (ref 12.0–15.0)
Lymphocytes Relative: 29.1 % (ref 12.0–46.0)
Lymphs Abs: 1.4 10*3/uL (ref 0.7–4.0)
MCHC: 33 g/dL (ref 30.0–36.0)
MCV: 94.6 fl (ref 78.0–100.0)
Monocytes Absolute: 0.5 10*3/uL (ref 0.1–1.0)
Monocytes Relative: 10.3 % (ref 3.0–12.0)
Neutro Abs: 2.5 10*3/uL (ref 1.4–7.7)
Neutrophils Relative %: 54.5 % (ref 43.0–77.0)
Platelets: 234 10*3/uL (ref 150.0–400.0)
RBC: 3.9 Mil/uL (ref 3.87–5.11)
RDW: 13.4 % (ref 11.5–15.5)
WBC: 4.7 10*3/uL (ref 4.0–10.5)

## 2021-02-23 LAB — BASIC METABOLIC PANEL
BUN: 10 mg/dL (ref 6–23)
CO2: 28 mEq/L (ref 19–32)
Calcium: 8.6 mg/dL (ref 8.4–10.5)
Chloride: 108 mEq/L (ref 96–112)
Creatinine, Ser: 0.92 mg/dL (ref 0.40–1.20)
GFR: 68.92 mL/min (ref >60.00)
Glucose, Bld: 67 mg/dL — ABNORMAL LOW (ref 70–99)
Potassium: 4.1 mEq/L (ref 3.5–5.1)
Sodium: 142 mEq/L (ref 135–145)

## 2021-02-23 MED ORDER — DICYCLOMINE HCL 10 MG PO CAPS: 10.0000 mg | ORAL_CAPSULE | Freq: Three times a day (TID) | ORAL | 0 refills | Status: DC

## 2021-02-23 NOTE — Patient Instructions (Addendum)
Your provider has requested that you go to the basement level for lab work before leaving today. Press "B" on the elevator. The lab is located at the first door on the left as you exit the elevator.   You have been scheduled for an endoscopy and colonoscopy. Please follow the written instructions given to you at your visit today. Please pick up your prep supplies at the pharmacy within the next 1-3 days. If you use inhalers (even only as needed), please bring them with you on the day of your procedure.   STOP NSAIDS  Take Benefiber 1 tablespoon 2-3 times a day  We will send Dicyclomine to your pharmacy  We will refer you to Pelvic Floor Therapy and they will contact you with that appointment  Gastroesophageal Reflux Disease, Adult Gastroesophageal reflux (GER) happens when acid from the stomach flows up into the tube that connects the mouth and the stomach (esophagus). Normally, food travels down the esophagus and stays in the stomach to be digested. However, when a person has GER, food and stomach acid sometimes move back up into the esophagus. If this becomes a more serious problem, the person may be diagnosed with a disease called gastroesophageal reflux disease (GERD). GERD occurs when the reflux: Happens often. Causes frequent or severe symptoms. Causes problems such as damage to the esophagus. When stomach acid comes in contact with the esophagus, the acid may cause inflammation in the esophagus. Over time, GERD may create small holes (ulcers) in the lining of the esophagus. What are the causes? This condition is caused by a problem with the muscle between the esophagus and the stomach (lower esophageal sphincter, or LES). Normally, the LES muscle closes after food passes through the esophagus to the stomach. When the LES is weakened or abnormal, it does not close properly, and that allows food and stomach acid to go back up into the esophagus. The LES can be weakened by certain dietary  substances, medicines, and medical conditions, including: Tobacco use. Pregnancy. Having a hiatal hernia. Alcohol use. Certain foods and beverages, such as coffee, chocolate, onions, and peppermint. What increases the risk? You are more likely to develop this condition if you: Have an increased body weight. Have a connective tissue disorder. Take NSAIDs, such as ibuprofen. What are the signs or symptoms? Symptoms of this condition include: Heartburn. Difficult or painful swallowing and the feeling of having a lump in the throat. A bitter taste in the mouth. Bad breath and having a large amount of saliva. Having an upset or bloated stomach and belching. Chest pain. Different conditions can cause chest pain. Make sure you see your health care provider if you experience chest pain. Shortness of breath or wheezing. Ongoing (chronic) cough or a nighttime cough. Wearing away of tooth enamel. Weight loss. How is this diagnosed? This condition may be diagnosed based on a medical history and a physical exam. To determine if you have mild or severe GERD, your health care provider may also monitor how you respond to treatment. You may also have tests, including: A test to examine your stomach and esophagus with a small camera (endoscopy). A test that measures the acidity level in your esophagus. A test that measures how much pressure is on your esophagus. A barium swallow or modified barium swallow test to show the shape, size, and functioning of your esophagus. How is this treated? Treatment for this condition may vary depending on how severe your symptoms are. Your health care provider may recommend: Changes  to your diet. Medicine. Surgery. The goal of treatment is to help relieve your symptoms and to prevent complications. Follow these instructions at home: Eating and drinking  Follow a diet as recommended by your health care provider. This may involve avoiding foods and drinks such  as: Coffee and tea, with or without caffeine. Drinks that contain alcohol. Energy drinks and sports drinks. Carbonated drinks or sodas. Chocolate and cocoa. Peppermint and mint flavorings. Garlic and onions. Horseradish. Spicy and acidic foods, including peppers, chili powder, curry powder, vinegar, hot sauces, and barbecue sauce. Citrus fruit juices and citrus fruits, such as oranges, lemons, and limes. Tomato-based foods, such as red sauce, chili, salsa, and pizza with red sauce. Fried and fatty foods, such as donuts, french fries, potato chips, and high-fat dressings. High-fat meats, such as hot dogs and fatty cuts of red and white meats, such as rib eye steak, sausage, ham, and bacon. High-fat dairy items, such as whole milk, butter, and cream cheese. Eat small, frequent meals instead of large meals. Avoid drinking large amounts of liquid with your meals. Avoid eating meals during the 2-3 hours before bedtime. Avoid lying down right after you eat. Do not exercise right after you eat. Lifestyle  Do not use any products that contain nicotine or tobacco. These products include cigarettes, chewing tobacco, and vaping devices, such as e-cigarettes. If you need help quitting, ask your health care provider. Try to reduce your stress by using methods such as yoga or meditation. If you need help reducing stress, ask your health care provider. If you are overweight, reduce your weight to an amount that is healthy for you. Ask your health care provider for guidance about a safe weight loss goal. General instructions Pay attention to any changes in your symptoms. Take over-the-counter and prescription medicines only as told by your health care provider. Do not take aspirin, ibuprofen, or other NSAIDs unless your health care provider told you to take these medicines. Wear loose-fitting clothing. Do not wear anything tight around your waist that causes pressure on your abdomen. Raise (elevate) the  head of your bed about 6 inches (15 cm). You can use a wedge to do this. Avoid bending over if this makes your symptoms worse. Keep all follow-up visits. This is important. Contact a health care provider if: You have: New symptoms. Unexplained weight loss. Difficulty swallowing or it hurts to swallow. Wheezing or a persistent cough. A hoarse voice. Your symptoms do not improve with treatment. Get help right away if: You have sudden pain in your arms, neck, jaw, teeth, or back. You suddenly feel sweaty, dizzy, or light-headed. You have chest pain or shortness of breath. You vomit and the vomit is green, yellow, or black, or it looks like blood or coffee grounds. You faint. You have stool that is red, bloody, or black. You cannot swallow, drink, or eat. These symptoms may represent a serious problem that is an emergency. Do not wait to see if the symptoms will go away. Get medical help right away. Call your local emergency services (911 in the [place] Do not drive yourself to the hospital. Summary Gastroesophageal reflux happens when acid from the stomach flows up into the esophagus. GERD is a disease in which the reflux happens often, causes frequent or severe symptoms, or causes problems such as damage to the esophagus. Treatment for this condition may vary depending on how severe your symptoms are. Your health care provider may recommend diet and lifestyle changes, medicine, or surgery. Contact  a health care provider if you have new or worsening symptoms. Take over-the-counter and prescription medicines only as told by your health care provider. Do not take aspirin, ibuprofen, or other NSAIDs unless your health care provider told you to do so. Keep all follow-up visits as told by your health care provider. This is important. This information is not intended to replace advice given to you by your health care provider. Make sure you discuss any questions you have with your health care  provider. Document Revised: 10/04/2019 Document Reviewed: 10/04/2019 Elsevier Patient Education  2022 [employer].   Due to recent changes in healthcare laws, you may see the results of your imaging and laboratory studies on MyChart before your provider has had a chance to review them.  We understand that in some cases there may be results that are confusing or concerning to you. Not all laboratory results come back in the same time frame and the provider may be waiting for multiple results in order to interpret others.  Please give [place] 48 hours in order for your provider to thoroughly review all the results before contacting the office for clarification of your results.    I appreciate the  opportunity to care for you  Thank You   Marsa Aris , MD

## 2021-02-23 NOTE — Progress Notes (Signed)
Barbara Edwards    [id number]    [birth year]  Primary Care Physician:Prochnau, Rayfield Citizen, MD  Referring Physician: Philemon Kingdom, MD [address] [place],  [place] [address]   Chief complaint: Constipation alternating with diarrhea, fecal incontinence, nausea, vomiting and melena  HPI:  [name] in [place] is here to establish care with complaints of irregular bowel habits, fecal incontinence, abdominal discomfort, nausea and vomiting.  She has alternating constipation and diarrhea, she has sensation of incomplete evacuation, has to strain at times to evacuate and may also need to use a gloved finger She has noticed intermittent dark tarry stool, she takes daily aspirin and also uses ibuprofen as needed She has epigastric and left-sided abdominal pain intermittently Also has nausea multiple times a week with occasional vomiting  She is status post cholecystectomy Her mother had Crohn's disease.  Colonoscopy 06/27/2014 at [facility] by Dr. Chales Abrahams which identified a 1 cm flat sessile polyp removed from the proximal descending colon and 3 polyps were removed from the proximal sigmoid colon. Mild sigmoid diverticulosis was noted.  Biopsies were consistent with tubular adenomatous and hyperplastic polyps without dysplasia.  Outpatient Encounter Medications as of 02/23/2021  Medication Sig   cholecalciferol (VITAMIN D3) 25 MCG (1000 UNIT) tablet Take 1,000 Units by mouth daily.   diazepam (VALIUM) 10 MG tablet Take 10 mg by mouth at bedtime as needed for sleep.   escitalopram (LEXAPRO) 10 MG tablet Take 10 mg by mouth daily.   estradiol (ESTRACE) 0.5 MG tablet Take 0.5 mg by mouth daily.   ibuprofen (ADVIL) 200 MG tablet Take 400 mg by mouth every 6 (six) hours as needed for headache or mild pain.   zinc gluconate 50 MG tablet Take 50 mg by mouth daily.   [DISCONTINUED] gabapentin (NEURONTIN) 600 MG tablet Take 1  tablet (600 mg total) by mouth at bedtime.   No facility-administered encounter medications on file as of 02/23/2021.    Allergies as of 02/23/2021 - Review Complete 02/23/2021  Allergen Reaction Noted   Prednisone Hives 12/16/2019   Sulfa antibiotics Diarrhea and Nausea Only 12/05/2020    Past Medical History:  Diagnosis Date   Anemia 06/22/2019   Anxiety    Bipolar disorder (HCC)    Depression    Fibromyalgia    Fibromyalgia 11/17/2020   GERD (gastroesophageal reflux disease)    Headache    History of kidney stones    Sleep apnea     Past Surgical History:  Procedure Laterality Date   ABDOMINAL HYSTERECTOMY  2018   partial   CHOLECYSTECTOMY     NO PAST SURGERIES      No family history on file.  Social History   Socioeconomic History   Marital status: Married    Spouse name: Windy Fast   Number of children: Not on file   Years of education: Not on file   Highest education level: 12th grade  Occupational History   Occupation: unemployed  Tobacco Use   Smoking status: Former    Years: 15.00    Types: Cigarettes   Smokeless tobacco: Never  Vaping Use   Vaping Use: Never used  Substance and Sexual Activity   Alcohol use: Yes    Alcohol/week: 2.0 standard drinks    Types: 2 Shots of liquor per week    Comment: occasional    Drug use: Not Currently   Sexual activity: Yes  Birth control/protection: Post-menopausal    Comment: estradoil   Other Topics Concern   Not on file  Social History Narrative   Lives alone   Right Handed   Drinks 1 cup caffeine daily   Social Determinants of Health   Financial Resource Strain: Not on file  Food Insecurity: Not on file  Transportation Needs: Not on file  Physical Activity: Not on file  Stress: Not on file  Social Connections: Not on file  Intimate Partner Violence: Not on file      Review of systems: All other review of systems negative except as mentioned in the HPI.   Physical Exam: Vitals:   02/23/21  1035  BP: 112/66  Pulse: 70   Body mass index is 31.76 kg/m. Gen:      No acute distress HEENT:  sclera anicteric Abd:      soft, non-tender; no palpable masses, no distension Ext:    No edema Neuro: alert and oriented x 3 Psych: normal mood and affect  Data Reviewed:  Reviewed labs, radiology imaging, old records and pertinent past GI work up   Assessment and Plan/Recommendations:  60 year old very pleasant female with history of multiple advanced adenomatous colon polyps with complaints of irregular bowel habits with alternating constipation and diarrhea, fecal incontinence, epigastric and left-sided abdominal pain, intermittent melena, nausea and vomiting  She is due for surveillance colonoscopy, will schedule it.  We will also plan to obtain biopsies if he has any features of colitis or ileitis  Melena, abdominal pain, nausea and vomiting: Possible etiology gastritis secondary to frequent NSAID use.  Will need to exclude peptic ulcer disease or H. 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 The patient was provided an opportunity to ask questions and all were answered. The patient agreed with the plan and demonstrated an understanding of the instructions.  [name] , MD    CC: Ernestene Kiel,  MD

## 2021-03-16 ENCOUNTER — Other Ambulatory Visit: Payer: Self-pay | Admitting: Neurosurgery

## 2021-03-16 DIAGNOSIS — M5412 Radiculopathy, cervical region: Secondary | ICD-10-CM

## 2021-04-07 ENCOUNTER — Ambulatory Visit
Admission: RE | Admit: 2021-04-07 | Discharge: 2021-04-07 | Disposition: A | Discharge status: Home / Self Care | Payer: Medicare Other | Source: Ambulatory Visit | Attending: Neurosurgery | Admitting: Neurosurgery

## 2021-04-07 ENCOUNTER — Other Ambulatory Visit: Payer: Self-pay

## 2021-04-07 DIAGNOSIS — M2578 Osteophyte, vertebrae: Secondary | ICD-10-CM | POA: Diagnosis not present

## 2021-04-07 DIAGNOSIS — M47812 Spondylosis without myelopathy or radiculopathy, cervical region: Secondary | ICD-10-CM | POA: Diagnosis not present

## 2021-04-07 DIAGNOSIS — M4802 Spinal stenosis, cervical region: Secondary | ICD-10-CM | POA: Diagnosis not present

## 2021-04-07 DIAGNOSIS — M5412 Radiculopathy, cervical region: Secondary | ICD-10-CM
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
## 2021-04-13 ENCOUNTER — Ambulatory Visit (AMBULATORY_SURGERY_CENTER): Payer: Medicare Other | Admitting: Gastroenterology

## 2021-04-13 ENCOUNTER — Other Ambulatory Visit: Payer: Self-pay

## 2021-04-13 ENCOUNTER — Encounter: Payer: Self-pay | Admitting: Gastroenterology

## 2021-04-13 VITALS — BP 118/73 | HR 60 | Temp 97.3°F | Resp 16 | Ht 64.0 in | Wt 185.0 lb

## 2021-04-13 DIAGNOSIS — K2950 Unspecified chronic gastritis without bleeding: Secondary | ICD-10-CM | POA: Diagnosis not present

## 2021-04-13 DIAGNOSIS — Z86010 Personal history of colonic polyps: Secondary | ICD-10-CM | POA: Diagnosis not present

## 2021-04-13 DIAGNOSIS — K209 Esophagitis, unspecified without bleeding: Secondary | ICD-10-CM | POA: Diagnosis not present

## 2021-04-13 DIAGNOSIS — K229 Disease of esophagus, unspecified: Secondary | ICD-10-CM

## 2021-04-13 DIAGNOSIS — Z538 Procedure and treatment not carried out for other reasons: Secondary | ICD-10-CM

## 2021-04-13 DIAGNOSIS — K2100 Gastro-esophageal reflux disease with esophagitis, without bleeding: Secondary | ICD-10-CM

## 2021-04-13 DIAGNOSIS — R112 Nausea with vomiting, unspecified: Secondary | ICD-10-CM | POA: Diagnosis not present

## 2021-04-13 MED ORDER — SODIUM CHLORIDE 0.9 % IV SOLN
500.0000 mL | Freq: Once | INTRAVENOUS | Status: DC
Start: 2021-04-13 — End: 2021-04-13

## 2021-04-13 MED ORDER — LANSOPRAZOLE 30 MG PO CPDR: 30.0000 mg | DELAYED_RELEASE_CAPSULE | Freq: Every day | ORAL | 2 refills | Status: DC

## 2021-04-13 NOTE — Patient Instructions (Addendum)
Handouts provided on esophagitis, hiatal hernia, anti-reflex regimen.   No aspirin, ibuprofen, naproxen, or other non-steriodal anti-inflammatory drugs (NSAIDs). You may use Tylenol if needed for mild pain or fever.   Use Prevacid (lansoprazole) 30mg  by mouth daily for three months. Take 30 minutes before breakfast.   Follow an anti-reflux regimen.   Return to GI office in 2 months. Please call to schedule follow up visit.    YOU HAD AN ENDOSCOPIC PROCEDURE TODAY AT THE [facility]:   Refer to the procedure report that was given to you for any specific questions about what was found during the examination.  If the procedure report does not answer your questions, please call your gastroenterologist to clarify.  If you requested that your care partner not be given the details of your procedure findings, then the procedure report has been included in a sealed envelope for you to review at your convenience later.  YOU SHOULD EXPECT: Some feelings of bloating in the abdomen. Passage of more gas than usual.  Walking can help get rid of the air that was put into your GI tract during the procedure and reduce the bloating. If you had a lower endoscopy (such as a colonoscopy or flexible sigmoidoscopy) you may notice spotting of blood in your stool or on the toilet paper. If you underwent a bowel prep for your procedure, you may not have a normal bowel movement for a few days.  Please Note:  You might notice some irritation and congestion in your nose or some drainage.  This is from the oxygen used during your procedure.  There is no need for concern and it should clear up in a day or so.  SYMPTOMS TO REPORT IMMEDIATELY:  Following lower endoscopy (colonoscopy or flexible sigmoidoscopy):  Excessive amounts of blood in the stool  Significant tenderness or worsening of abdominal pains  Swelling of the abdomen that is new, acute  Fever of 100F or higher  Following upper endoscopy  (EGD)  Vomiting of blood or coffee ground material  New chest pain or pain under the shoulder blades  Painful or persistently difficult swallowing  New shortness of breath  Fever of 100F or higher  Black, tarry-looking stools  For urgent or emergent issues, a gastroenterologist can be reached at any hour by calling [phone]. Do not use MyChart messaging for urgent concerns.    DIET:  We do recommend a small meal at first, but then you may proceed to your regular diet.  Drink plenty of fluids but you should avoid alcoholic beverages for 24 hours.  ACTIVITY:  You should plan to take it easy for the rest of today and you should NOT DRIVE or use heavy machinery until tomorrow (because of the sedation medicines used during the test).    FOLLOW UP: Our staff will call the number listed on your records 48-72 hours following your procedure to check on you and address any questions or concerns that you may have regarding the information given to you following your procedure. If we do not reach you, we will leave a message.  We will attempt to reach you two times.  During this call, we will ask if you have developed any symptoms of COVID 19. If you develop any symptoms (ie: fever, flu-like symptoms, shortness of breath, cough etc.) before then, please call [phone].  If you test positive for Covid 19 in the 2 weeks post procedure, please call and report this information to [phone].  If any biopsies were taken you will be contacted by phone or by letter within the next 1-3 weeks.  Please call us at [phone] if you have not heard about the biopsies in 3 weeks.    SIGNATURES/CONFIDENTIALITY: You and/or your care partner have signed paperwork which will be entered into your electronic medical record.  These signatures attest to the fact that that the information above on your After Visit Summary has been reviewed and is understood.  Full responsibility of the confidentiality of this discharge  information lies with you and/or your care-partner.

## 2021-04-13 NOTE — Op Note (Signed)
[place] Patient Name: Barbara Edwards Procedure Date: 04/13/2021 2:07 PM MRN: [id number] Endoscopist: Mauri Pole , MD Age: 61 Referring MD:  Date of Birth: 06/12/1960 Gender: Female Account #: [id number] Procedure:                Upper GI endoscopy Indications:              Persistent vomiting of unknown cause, Epigastric                            abdominal pain Medicines:                Monitored Anesthesia Care Procedure:                Pre-Anesthesia Assessment:                           - Prior to the procedure, a History and Physical                            was performed, and patient medications and                            allergies were reviewed. The patient's tolerance of                            previous anesthesia was also reviewed. The risks                            and benefits of the procedure and the sedation                            options and risks were discussed with the patient.                            All questions were answered, and informed consent                            was obtained. Prior Anticoagulants: The patient has                            taken no previous anticoagulant or antiplatelet                            agents. ASA Grade Assessment: II - A patient with                            mild systemic disease. After reviewing the risks                            and benefits, the patient was deemed in                            satisfactory condition to undergo the procedure.  After obtaining informed consent, the endoscope was                            passed under direct vision. Throughout the                            procedure, the patient's blood pressure, pulse, and                            oxygen saturations were monitored continuously. The                            Endoscope was introduced through the mouth, and                            advanced to the second part of  duodenum. The upper                            GI endoscopy was accomplished without difficulty.                            The patient tolerated the procedure well. Scope In: Scope Out: Findings:                 LA Grade C (one or more mucosal breaks continuous                            between tops of 2 or more mucosal folds, less than                            75% circumference) esophagitis with no bleeding and                            mucosal nodularity was found 34 to 38 cm from the                            incisors. Biopsies were taken with a cold forceps                            for histology.                           A 3 cm hiatal hernia was present.                           The stomach was normal.                           The cardia and gastric fundus were normal on                            retroflexion.                           The examined duodenum  was normal. Complications:            No immediate complications. Estimated Blood Loss:     Estimated blood loss was minimal. Impression:               - LA Grade C erosive esophagitis with no bleeding.                            Biopsied.                           - 3 cm hiatal hernia.                           - Normal stomach.                           - Normal examined duodenum. Recommendation:           - Resume previous diet.                           - Continue present medications.                           - Use Prevacid (lansoprazole) 30 mg PO daily, 30                            minutes before breakfast for 3 months.                           - Follow an antireflux regimen.                           - No ibuprofen, naproxen, or other non-steroidal                            anti-inflammatory drugs.                           - Await pathology results.                           - Return to GI office in 2 months. Please call to                            schedule follow up visit. Mauri Pole,  MD 04/13/2021 2:56:46 PM This report has been signed electronically.

## 2021-04-13 NOTE — Progress Notes (Signed)
A and O x3. Report to RN. Tolerated MAC anesthesia well.Teeth unchanged after procedure. 

## 2021-04-13 NOTE — Progress Notes (Signed)
Called to room to assist during endoscopic procedure.  Patient ID and intended procedure confirmed with present staff. Received instructions for my participation in the procedure from the performing physician.  

## 2021-04-13 NOTE — Progress Notes (Signed)
[facility] History and Physical   Primary Care Physician:  Ernestene Kiel, MD   Reason for Procedure:  Melena, nausea, vomiting, and h/o colon adenomatous polyps  Plan:    EGD and colonoscopy with possible interventions as needed     HPI: TAIWAN SCHOOL is a very pleasant 61 y.o. female here for EGD for further evaluation of melena, nausea and vomiting. Colonoscopy for surveillance due to h/o adenomatous colon polyps . Please refer to office visit note 02/23/21 for additional details  The risks and benefits as well as alternatives of endoscopic procedure(s) have been discussed and reviewed. All questions answered. The patient agrees to proceed.    Past Medical History:  Diagnosis Date   Anemia 06/22/2019   Anxiety    Bipolar disorder ([place])    Depression    Fibromyalgia    Fibromyalgia 11/17/2020   GERD (gastroesophageal reflux disease)    Headache    History of kidney stones    Sleep apnea     Past Surgical History:  Procedure Laterality Date   ABDOMINAL HYSTERECTOMY  2018   partial   CHOLECYSTECTOMY     NO PAST SURGERIES      Prior to Admission medications   Medication Sig Start Date End Date Taking? Authorizing Provider  cholecalciferol (VITAMIN D3) 25 MCG (1000 UNIT) tablet Take 1,000 Units by mouth daily.   Yes [provider]  diazepam (VALIUM) 10 MG tablet Take 10 mg by mouth at bedtime as needed for sleep. 06/14/20  Yes [provider]  escitalopram (LEXAPRO) 10 MG tablet Take 10 mg by mouth daily. 05/24/20  Yes [provider]  estradiol (ESTRACE) 0.5 MG tablet Take 0.5 mg by mouth daily. 05/24/20  Yes [provider]  dicyclomine (BENTYL) 10 MG capsule Take 1 capsule (10 mg total) by mouth 3 (three) times daily before meals. 02/23/21   Mauri Pole, MD  ibuprofen (ADVIL) 200 MG tablet Take 400 mg by mouth every 6 (six) hours as needed for headache or mild pain.    [provider]  zinc  gluconate 50 MG tablet Take 50 mg by mouth daily. Patient not taking: Reported on 04/13/2021    [provider]    Current Outpatient Medications  Medication Sig Dispense Refill   cholecalciferol (VITAMIN D3) 25 MCG (1000 UNIT) tablet Take 1,000 Units by mouth daily.     diazepam (VALIUM) 10 MG tablet Take 10 mg by mouth at bedtime as needed for sleep.     escitalopram (LEXAPRO) 10 MG tablet Take 10 mg by mouth daily.     estradiol (ESTRACE) 0.5 MG tablet Take 0.5 mg by mouth daily.     dicyclomine (BENTYL) 10 MG capsule Take 1 capsule (10 mg total) by mouth 3 (three) times daily before meals. 90 capsule 0   ibuprofen (ADVIL) 200 MG tablet Take 400 mg by mouth every 6 (six) hours as needed for headache or mild pain.     zinc gluconate 50 MG tablet Take 50 mg by mouth daily. (Patient not taking: Reported on 04/13/2021)     Current Facility-Administered Medications  Medication Dose Route Frequency Provider Last Rate Last Admin   0.9 %  sodium chloride infusion  500 mL Intravenous Once Mauri Pole, MD        Allergies as of 04/13/2021 - Review Complete 04/13/2021  Allergen Reaction Noted   Prednisone Hives 12/16/2019   Sulfa antibiotics Diarrhea and Nausea Only 12/05/2020    History reviewed. No pertinent  family history.  Social History   Socioeconomic History   Marital status: Married    Spouse name: Jori Moll   Number of children: Not on file   Years of education: Not on file   Highest education level: 12th grade  Occupational History   Occupation: unemployed  Tobacco Use   Smoking status: Former    Years: 15.00    Types: Cigarettes   Smokeless tobacco: Never  Vaping Use   Vaping Use: Never used  Substance and Sexual Activity   Alcohol use: Yes    Alcohol/week: 2.0 standard drinks    Types: 2 Shots of liquor per week    Comment: occasional    Drug use: Not Currently   Sexual activity: Yes    Birth control/protection: Post-menopausal    Comment: estradoil    Other Topics Concern   Not on file  Social History Narrative   Lives alone   Right Handed   Drinks 1 cup caffeine daily   Social Determinants of Health   Financial Resource Strain: Not on file  Food Insecurity: Not on file  Transportation Needs: Not on file  Physical Activity: Not on file  Stress: Not on file  Social Connections: Not on file  Intimate Partner Violence: Not on file    Review of Systems:  All other review of systems negative except as mentioned in the HPI.  Physical Exam: Vital signs in last 24 hours: BP 112/71    Pulse 83    Temp (!) 97.3 F (36.3 C)    Ht 5\' 4"  (1.626 m)    Wt 185 lb (83.9 kg)    SpO2 99%    BMI 31.76 kg/m  General:   Alert, NAD Lungs:  Clear .   Heart:  Regular rate and rhythm Abdomen:  Soft, nontender and nondistended. Neuro/Psych:  Alert and cooperative. Normal mood and affect. A and O x 3  Reviewed labs, radiology imaging, old records and pertinent past GI work up  Patient is appropriate for planned procedure(s) and anesthesia in an ambulatory setting   K. Denzil Magnuson , MD [phone]

## 2021-04-13 NOTE — Progress Notes (Signed)
Pt with poor prep. Repeat colonoscopy scheduled for 05/04/21 at 2:30p. Also a pre-visit is scheduled for 04/23/21 at 2:30p. Pt will need a 2 day prep.

## 2021-04-13 NOTE — Op Note (Signed)
[place] Patient Name: Barbara Edwards Procedure Date: 04/13/2021 2:06 PM MRN: [id number] Endoscopist: Mauri Pole , MD Age: 61 Referring MD:  Date of Birth: 05/30/1960 Gender: Female Account #: [id number] Procedure:                Colonoscopy Indications:              High risk colon cancer surveillance: Personal                            history of colonic polyps, High risk colon cancer                            surveillance: Personal history of adenoma (10 mm or                            greater in size) Medicines:                Monitored Anesthesia Care Procedure:                Pre-Anesthesia Assessment:                           - Prior to the procedure, a History and Physical                            was performed, and patient medications and                            allergies were reviewed. The patient's tolerance of                            previous anesthesia was also reviewed. The risks                            and benefits of the procedure and the sedation                            options and risks were discussed with the patient.                            All questions were answered, and informed consent                            was obtained. Prior Anticoagulants: The patient has                            taken no previous anticoagulant or antiplatelet                            agents. ASA Grade Assessment: II - A patient with                            mild systemic disease. After reviewing the risks  and benefits, the patient was deemed in                            satisfactory condition to undergo the procedure.                           After obtaining informed consent, the colonoscope                            was passed under direct vision. Throughout the                            procedure, the patient's blood pressure, pulse, and                            oxygen saturations were monitored  continuously. The                            Olympus PCF-H190DL [id number]) Colonoscope was                            introduced through the anus and advanced to the the                            cecum, identified by appendiceal orifice and                            ileocecal valve. The colonoscopy was performed                            without difficulty. The patient tolerated the                            procedure well. The quality of the bowel                            preparation was excellent. The ileocecal valve,                            appendiceal orifice, and rectum were photographed. Scope In: 2:35:31 PM Scope Out: 2:43:50 PM Scope Withdrawal Time: 0 hours 3 minutes 29 seconds  Total Procedure Duration: 0 hours 8 minutes 19 seconds  Findings:                 The perianal and digital rectal examinations were                            normal.                           A moderate amount of stool was found in the entire                            colon, making visualization difficult. Lavage of  the area was performed, resulting in incomplete                            clearance with continued poor visualization.                           The exam was otherwise without abnormality on                            direct and retroflexion views. Complications:            No immediate complications. Estimated Blood Loss:     Estimated blood loss was minimal. Impression:               - Stool in the entire examined colon.                           - The examination was otherwise normal on direct                            and retroflexion views.                           - No specimens collected. Recommendation:           - Patient has a contact number available for                            emergencies. The signs and symptoms of potential                            delayed complications were discussed with the                            patient.  Return to normal activities tomorrow.                            Written discharge instructions were provided to the                            patient.                           - Resume previous diet.                           - Continue present medications.                           - Repeat colonoscopy at the next available                            appointment because the bowel preparation was poor.                           - For future colonoscopy the patient will require  an extended preparation. If there are any                            questions, please contact the gastroenterologist. Mauri Pole, MD 04/13/2021 2:52:28 PM This report has been signed electronically.

## 2021-04-17 ENCOUNTER — Telehealth: Payer: Self-pay | Admitting: *Deleted

## 2021-04-17 ENCOUNTER — Telehealth: Payer: Self-pay

## 2021-04-17 NOTE — Telephone Encounter (Signed)
°  Follow up Call-  Call back number 04/13/2021  Post procedure Call Back phone  # [phone]  Permission to leave phone message Yes  Some recent data might be hidden     Patient questions:  Message left to call us if necessary.

## 2021-04-17 NOTE — Telephone Encounter (Signed)
Called the patient back as she had questions regarding the prep for her rescheduled colon on 05/04/21 after inadequate prep on 04/13/21. Her PV call is scheduled for 04/23/21 at 2:30 and reassured the patient that prep possibilities and education will be discussed at that time.

## 2021-04-17 NOTE — Telephone Encounter (Signed)
Inbound call from patient requesting to speak with a nurse please. 

## 2021-04-17 NOTE — Telephone Encounter (Signed)
Second attempt follow up call to pt, no answer.  

## 2021-04-19 ENCOUNTER — Encounter: Payer: Self-pay | Admitting: Gastroenterology

## 2021-04-19 ENCOUNTER — Telehealth: Payer: Self-pay | Admitting: Gastroenterology

## 2021-04-19 ENCOUNTER — Other Ambulatory Visit: Payer: Self-pay

## 2021-04-19 DIAGNOSIS — K209 Esophagitis, unspecified without bleeding: Secondary | ICD-10-CM

## 2021-04-19 MED ORDER — LANSOPRAZOLE 30 MG PO CPDR: DELAYED_RELEASE_CAPSULE | ORAL | 2 refills | Status: DC

## 2021-04-19 NOTE — Telephone Encounter (Signed)
Called patient and left her a message. Tried to call [facility]. Unsuccessful. Escribed the Prevacid again.

## 2021-04-19 NOTE — Telephone Encounter (Signed)
Patient called said she was suppose to get a script after her procedure but there is nothing at the pharmacy. Requested for it to be sent and also to call her once done so she knows.

## 2021-04-23 ENCOUNTER — Other Ambulatory Visit: Payer: Self-pay

## 2021-04-23 ENCOUNTER — Ambulatory Visit (AMBULATORY_SURGERY_CENTER): Payer: Medicare Other | Admitting: *Deleted

## 2021-04-23 VITALS — Ht 64.0 in | Wt 185.0 lb

## 2021-04-23 DIAGNOSIS — Z860100 Personal history of colon polyps, unspecified: Secondary | ICD-10-CM

## 2021-04-23 MED ORDER — NA SULFATE-K SULFATE-MG SULF 17.5-3.13-1.6 GM/177ML PO SOLN: 1.0000 | ORAL | 0 refills | Status: DC

## 2021-04-23 NOTE — Progress Notes (Signed)
Patient's pre-visit was done today over the phone with the patient. Name,DOB and address verified. Patient denies any allergies to Eggs and Soy. Patient denies any problems with anesthesia/sedation. Patient is not taking any diet pills or blood thinners. No home Oxygen.  ° °Prep instructions sent to pt's MyChart-pt aware. Patient understands to call us back with any questions or concerns. Patient is aware of our care-partner policy and Covid-19 safety protocol.  °

## 2021-04-25 DIAGNOSIS — N390 Urinary tract infection, site not specified: Secondary | ICD-10-CM | POA: Diagnosis not present

## 2021-04-26 DIAGNOSIS — Z6830 Body mass index (BMI) 30.0-30.9, adult: Secondary | ICD-10-CM | POA: Diagnosis not present

## 2021-04-26 DIAGNOSIS — M5412 Radiculopathy, cervical region: Secondary | ICD-10-CM | POA: Diagnosis not present

## 2021-04-30 DIAGNOSIS — M5412 Radiculopathy, cervical region: Secondary | ICD-10-CM | POA: Diagnosis not present

## 2021-05-01 ENCOUNTER — Encounter: Payer: Self-pay | Admitting: Gastroenterology

## 2021-05-02 DIAGNOSIS — M5412 Radiculopathy, cervical region: Secondary | ICD-10-CM | POA: Diagnosis not present

## 2021-05-03 ENCOUNTER — Telehealth: Payer: Self-pay | Admitting: Gastroenterology

## 2021-05-03 NOTE — Telephone Encounter (Signed)
Feel as if going to faint, nausea, weak feeling, pain in upper stomach area. Would like a call back to discuss before upcoming procedure 1/27

## 2021-05-03 NOTE — Telephone Encounter (Signed)
Pt called and she was feeling dizzy and light headed and well as upper abdominal pain after taking her Miralax and Dulcolax this morning. She doesn't think she can continue the prep and wants to cancel for tomorrow.  Will make Dr. Lavon Paganini aware.

## 2021-05-03 NOTE — Telephone Encounter (Signed)
Beth, can you please help schedule office visit with APP or me, next available non urgent visit to discuss how pt wants to proceed. We will not be able to do exam with out bowel prep and she has h/o advanced adenomatous polyps and is considered high risk for colorectal cancer screening. Not a candidate for Cologuard

## 2021-05-03 NOTE — Telephone Encounter (Signed)
Called the patient back. No answer. Left her a message to call me back to discuss an appointment.

## 2021-05-04 ENCOUNTER — Encounter: Payer: Medicare Other | Admitting: Gastroenterology

## 2021-05-07 ENCOUNTER — Other Ambulatory Visit: Payer: Self-pay

## 2021-05-10 DIAGNOSIS — M5412 Radiculopathy, cervical region: Secondary | ICD-10-CM | POA: Diagnosis not present

## 2021-05-14 DIAGNOSIS — M5412 Radiculopathy, cervical region: Secondary | ICD-10-CM | POA: Diagnosis not present

## 2021-05-25 DIAGNOSIS — H5213 Myopia, bilateral: Secondary | ICD-10-CM | POA: Diagnosis not present

## 2021-06-05 ENCOUNTER — Ambulatory Visit: Payer: Medicare Other | Admitting: Neurology

## 2021-06-12 DIAGNOSIS — H526 Other disorders of refraction: Secondary | ICD-10-CM | POA: Diagnosis not present

## 2021-06-15 DIAGNOSIS — R300 Dysuria: Secondary | ICD-10-CM | POA: Diagnosis not present

## 2021-06-15 DIAGNOSIS — N760 Acute vaginitis: Secondary | ICD-10-CM | POA: Diagnosis not present

## 2021-06-15 DIAGNOSIS — L292 Pruritus vulvae: Secondary | ICD-10-CM | POA: Diagnosis not present

## 2021-06-19 NOTE — Telephone Encounter (Signed)
Letter mailed to the patient

## 2021-06-26 DIAGNOSIS — N952 Postmenopausal atrophic vaginitis: Secondary | ICD-10-CM | POA: Diagnosis not present

## 2021-06-26 DIAGNOSIS — R32 Unspecified urinary incontinence: Secondary | ICD-10-CM | POA: Diagnosis not present

## 2021-06-26 DIAGNOSIS — N390 Urinary tract infection, site not specified: Secondary | ICD-10-CM | POA: Diagnosis not present

## 2021-06-26 DIAGNOSIS — R159 Full incontinence of feces: Secondary | ICD-10-CM | POA: Diagnosis not present

## 2021-06-28 ENCOUNTER — Other Ambulatory Visit: Payer: Self-pay

## 2021-06-28 ENCOUNTER — Encounter (HOSPITAL_COMMUNITY): Payer: Self-pay | Admitting: Pharmacy Technician

## 2021-06-28 ENCOUNTER — Emergency Department (HOSPITAL_COMMUNITY)
Admission: EM | Admit: 2021-06-28 | Discharge: 2021-06-28 | Discharge status: Against Medical Advice | Payer: Medicare Other | Attending: Medical | Admitting: Medical

## 2021-06-28 DIAGNOSIS — R42 Dizziness and giddiness: Secondary | ICD-10-CM | POA: Diagnosis not present

## 2021-06-28 DIAGNOSIS — R519 Headache, unspecified: Secondary | ICD-10-CM | POA: Insufficient documentation

## 2021-06-28 DIAGNOSIS — M549 Dorsalgia, unspecified: Secondary | ICD-10-CM | POA: Diagnosis not present

## 2021-06-28 DIAGNOSIS — Z5321 Procedure and treatment not carried out due to patient leaving prior to being seen by health care provider: Secondary | ICD-10-CM | POA: Diagnosis not present

## 2021-06-28 DIAGNOSIS — G4489 Other headache syndrome: Secondary | ICD-10-CM | POA: Diagnosis not present

## 2021-06-28 DIAGNOSIS — N390 Urinary tract infection, site not specified: Secondary | ICD-10-CM | POA: Insufficient documentation

## 2021-06-28 DIAGNOSIS — M542 Cervicalgia: Secondary | ICD-10-CM | POA: Diagnosis not present

## 2021-06-28 DIAGNOSIS — R55 Syncope and collapse: Secondary | ICD-10-CM | POA: Insufficient documentation

## 2021-06-28 DIAGNOSIS — M1990 Unspecified osteoarthritis, unspecified site: Secondary | ICD-10-CM | POA: Diagnosis not present

## 2021-06-28 DIAGNOSIS — M797 Fibromyalgia: Secondary | ICD-10-CM | POA: Diagnosis not present

## 2021-06-28 DIAGNOSIS — M791 Myalgia, unspecified site: Secondary | ICD-10-CM | POA: Diagnosis not present

## 2021-06-28 DIAGNOSIS — M2550 Pain in unspecified joint: Secondary | ICD-10-CM | POA: Diagnosis not present

## 2021-06-28 LAB — BASIC METABOLIC PANEL
Anion gap: 5 (ref 5–15)
BUN: 12 mg/dL (ref 6–20)
CO2: 25 mmol/L (ref 22–32)
Calcium: 8.7 mg/dL — ABNORMAL LOW (ref 8.9–10.3)
Chloride: 109 mmol/L (ref 98–111)
Creatinine, Ser: 1.05 mg/dL — ABNORMAL HIGH (ref 0.44–1.00)
GFR, Estimated: >60 mL/min (ref >60)
Glucose, Bld: 98 mg/dL (ref 70–99)
Potassium: 4.5 mmol/L (ref 3.5–5.1)
Sodium: 139 mmol/L (ref 135–145)

## 2021-06-28 LAB — CBC WITH DIFFERENTIAL/PLATELET
Abs Immature Granulocytes: 0.03 10*3/uL (ref 0.00–0.07)
Basophils Absolute: 0 10*3/uL (ref 0.0–0.1)
Basophils Relative: 1 %
Eosinophils Absolute: 0.1 10*3/uL (ref 0.0–0.5)
Eosinophils Relative: 2 %
HCT: 41.6 % (ref 36.0–46.0)
Hemoglobin: 13.5 g/dL (ref 12.0–15.0)
Immature Granulocytes: 1 %
Lymphocytes Relative: 32 %
Lymphs Abs: 2 10*3/uL (ref 0.7–4.0)
MCH: 31.2 pg (ref 26.0–34.0)
MCHC: 32.5 g/dL (ref 30.0–36.0)
MCV: 96.1 fL (ref 80.0–100.0)
Monocytes Absolute: 0.5 10*3/uL (ref 0.1–1.0)
Monocytes Relative: 8 %
Neutro Abs: 3.7 10*3/uL (ref 1.7–7.7)
Neutrophils Relative %: 56 %
Platelets: 287 10*3/uL (ref 150–400)
RBC: 4.33 MIL/uL (ref 3.87–5.11)
RDW: 12.8 % (ref 11.5–15.5)
WBC: 6.4 10*3/uL (ref 4.0–10.5)
nRBC: 0 % (ref 0.0–0.2)

## 2021-06-28 LAB — URINALYSIS, ROUTINE W REFLEX MICROSCOPIC
Bilirubin Urine: NEGATIVE
Glucose, UA: NEGATIVE mg/dL
Hgb urine dipstick: NEGATIVE
Ketones, ur: NEGATIVE mg/dL
Leukocytes,Ua: NEGATIVE
Nitrite: NEGATIVE
Protein, ur: NEGATIVE mg/dL
Specific Gravity, Urine: 1.011 (ref 1.005–1.030)
pH: 5 (ref 5.0–8.0)

## 2021-06-28 NOTE — ED Notes (Signed)
Patient refused respiratory panel swab. PA notified.  ?
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
## 2021-06-28 NOTE — ED Notes (Signed)
Pt called to be roomed, no response x2 

## 2021-06-28 NOTE — ED Triage Notes (Signed)
Pt bib ems from Dr office with headache neck pain back fibromyalgia for 2-3 days and dizziness X1 month. Dr. Isidore Moos called ems due to pt appearing to be near syncopal. Pt currently being treated for a UTI.  ?

## 2021-06-29 DIAGNOSIS — R102 Pelvic and perineal pain: Secondary | ICD-10-CM | POA: Diagnosis not present

## 2021-06-29 DIAGNOSIS — R32 Unspecified urinary incontinence: Secondary | ICD-10-CM | POA: Diagnosis not present

## 2021-06-29 DIAGNOSIS — Z87440 Personal history of urinary (tract) infections: Secondary | ICD-10-CM | POA: Diagnosis not present

## 2021-06-29 DIAGNOSIS — N390 Urinary tract infection, site not specified: Secondary | ICD-10-CM | POA: Diagnosis not present

## 2021-06-29 DIAGNOSIS — R1031 Right lower quadrant pain: Secondary | ICD-10-CM | POA: Diagnosis not present

## 2021-06-29 DIAGNOSIS — K5730 Diverticulosis of large intestine without perforation or abscess without bleeding: Secondary | ICD-10-CM | POA: Diagnosis not present

## 2021-06-29 DIAGNOSIS — R159 Full incontinence of feces: Secondary | ICD-10-CM | POA: Diagnosis not present

## 2021-06-29 DIAGNOSIS — Z9049 Acquired absence of other specified parts of digestive tract: Secondary | ICD-10-CM | POA: Diagnosis not present

## 2021-06-29 DIAGNOSIS — R109 Unspecified abdominal pain: Secondary | ICD-10-CM | POA: Diagnosis not present

## 2021-06-29 DIAGNOSIS — R1032 Left lower quadrant pain: Secondary | ICD-10-CM | POA: Diagnosis not present

## 2021-07-05 ENCOUNTER — Ambulatory Visit: Payer: Medicare Other | Admitting: Nurse Practitioner

## 2021-07-05 ENCOUNTER — Encounter: Payer: Self-pay | Admitting: Nurse Practitioner

## 2021-07-05 VITALS — BP 110/70 | HR 87 | Ht 66.0 in | Wt 187.0 lb

## 2021-07-05 DIAGNOSIS — R159 Full incontinence of feces: Secondary | ICD-10-CM

## 2021-07-05 DIAGNOSIS — Z860100 Personal history of colon polyps, unspecified: Secondary | ICD-10-CM

## 2021-07-05 NOTE — Patient Instructions (Signed)
We place placed a referral to AMB referral to rehabilitation ? ?Where: [facility] ?Address: [id number] [place] [address] ?Phone: [phone].  ?You should hear from their office in the next couple weeks to schedule an appointment. If you do not hear from them please call them at [phone]. ? ? ?Thank you for trusting me with your gastrointestinal care!   ? ?Willette Cluster, NP ? ? ? ? ?BMI: ? ?If you are age 84 or older, your body mass index should be between 23-30. Your Body mass index is 30.18 kg/m?Marland Kitchen If this is out of the aforementioned range listed, please consider follow up with your Primary Care Provider. ? ?If you are age 7 or younger, your body mass index should be between 19-25. Your Body mass index is 30.18 kg/m?Marland Kitchen If this is out of the aformentioned range listed, please consider follow up with your Primary Care Provider.  ? ?MY CHART: ? ?The [facility] providers would like to encourage you to use [facility] to communicate with providers for non-urgent requests or questions.  Due to long hold times on the telephone, sending your provider a message by [facility] may be a faster and more efficient way to get a response.  Please allow 48 business hours for a response.  Please remember that this is for non-urgent requests.  ? ?

## 2021-07-05 NOTE — Progress Notes (Signed)
? ? ?ASSESSMENT:   ? ?Patient Profile:  ?Barbara Edwards is a 61 y.o. female known to Dr. Silverio Decamp with a past medical history of colon polyps, diverticulosis, recurrent UTIs, pyelonephritis, anxiety, depression.  Additional medical history as listed in [employer] . ? ?# Fecal leakage -  poor sphincter tone.  ? ?# History of multiple advanced colon adenomas. Poor prep on colonoscopy in January 2023. She had a syncopal episode with two day bowel prep. Doesn't  want to do another colonoscopy unless we prep in hospital. She wants a cologuard.  ? ? ?PLAN:   ? ?Refer to pelvic floor PT in [place] ?Explained that Cologuard is not appropriate as it is a screening test for average risk patients and she has a history of multiple multiple polyps. I don't know that we are able to hospitalize her for a prep. Perhaps we could try a different prep?  ? ? ?History of Present Illness  ? ?Chief Complaint : fecal incontinence ? ?Sept 2022 New patient visit ?Patient established care with Carl Best PA for loose stool with fecal leakage. At that time it had already improved with dietary changes ( high fiber) and probiotics.   ? ?02/23/21 office follow up.  ?Alternating constipation / diarrhea, intermittent dark stools, fecal incontinence , epigastric and left sided abdominal pain and nausea / vomiting. 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 For evaluation of abdominal pain and dark stool she was also scheduled for EGD ( 3 cm HH and Grade C esophagitis). Repeat colonoscopy recommended.  ? ?Interval History:  ?She describes a syncopal episode during two day bowel prep.  She lives alone and doesn't want to do another colonoscopy unless hospitalized or prepped at our office for the procedure. She wants to do a Cologuard.  ? ?Barbara Edwards has leakage of soft stool when walking around but when tries to go to bathroom to empty rectum she  cannot get any stool to come out. Her BMs are generally the consistency of mud. She is taking fiber everyday as recommended but hasn't noticed any change in any stool frequency or consistency.  She hasn't gone to PT. Sounds like referral wasn't made??  ? ?Previous GI Evaluations  ? ?Endoscopies:  ? ?Colonoscopy 06/27/2014 at [facility] by Dr. Lyndel Safe ?-1 cm flat sessile polyp removed from the proximal descending colon and 3 polyps were removed from the proximal sigmoid colon. Mild sigmoid diverticulosis was noted. Biopsies were consistent with tubular adenomatous and hyperplastic polyps without dysplasia. ? ?January 2023 colonoscopy and EGD ?Colonoscopy  ?--Stool in the entire examined colon. ?- The examination was otherwise normal on direct and retroflexion views. ?- No specimens collected. ?EGD ?--LA Grade C erosive esophagitis with no bleeding. Biopsied. ?- 3 cm hiatal hernia. ?- Normal stomach. ?- Normal examined duodenum ?Diagnosis ?Surgical [P], random sites esophagus ?REACTIVE SQUAMOUS MUCOSA ?MINIMAL CHRONIC GASTRITIS WITH FOVEOLAR HYPERPLASIA ?NEGATIVE FOR INTESTINAL METAPLASIA, DYSPLASIA AND CARCINOMA ? ?Imaging:  ?3/39/22 Non-contrast CTAP for R flank pain  ?IMPRESSION: ?1. Mild hydronephrosis on the right without evident renal or ?ureteral calculus. Question recent calculus passage on the right. ?Pyelonephritis could present in this manner and is a differential ?consideration. No appreciable renal abscess on noncontrast enhanced ?study. ? 2. Sigmoid diverticula without diverticulitis. No bowel wall ?thickening or bowel obstruction. No abscess in the abdomen or ?pelvis. No periappendiceal region inflammatory change. ? 3.  Gallbladder absent.  Uterus absent. ?  ? ?  Past Medical History:  ?Diagnosis Date  ? Anemia 06/22/2019  ? Anxiety   ? Bipolar disorder ([place])   ? Depression   ? Fibromyalgia   ? Fibromyalgia 11/17/2020  ? GERD (gastroesophageal reflux disease)   ? Headache   ? History of kidney stones    ? Sleep apnea   ? ?Current Medications, Allergies, Family History and Social History were reviewed in [employer] record. ?  ?  ?Current Outpatient Medications  ?Medication Sig Dispense Refill  ? diazepam (VALIUM) 10 MG tablet Take 10 mg by mouth at bedtime as needed for sleep.    ? escitalopram (LEXAPRO) 10 MG tablet Take 10 mg by mouth daily.    ? estradiol (ESTRACE) 0.5 MG tablet Take 0.5 mg by mouth daily.    ? lansoprazole (PREVACID) 30 MG capsule Take on an empty stomach 30 minutes before breakfast 30 capsule 2  ? ?No current facility-administered medications for this visit.  ? ? ?Review of Systems: ?No chest pain. No shortness of breath. No urinary complaints.  ? ?Physical Exam:   ? ?Wt Readings from Last 3 Encounters:  ?07/05/21 187 lb (84.8 kg)  ?04/23/21 185 lb (83.9 kg)  ?04/13/21 185 lb (83.9 kg)  ? ? ?BP 110/70   Pulse 87   Ht 5\' 6"  (1.676 m)   Wt 187 lb (84.8 kg)   BMI 30.18 kg/m?  ?Constitutional:  Generally well appearing female in no acute distress. ?Psychiatric: Normal mood and affect. Behavior is normal. ?EENT: Pupils normal.  Conjunctivae are normal. No scleral icterus. ?Neck supple.  ?Cardiovascular: Normal rate, regular rhythm. No edema ?Pulmonary/chest: Effort normal and breath sounds normal. No wheezing, rales or rhonchi. ?Abdominal: Soft, nondistended, nontender. Bowel sounds active throughout. There are no masses palpable. No hepatomegaly. ?Rectal:  Diminished sphincter squeeze pressure. Unformed brown stool in vault.  ?Neurological: Alert and oriented to person place and time. ?Skin: Skin is warm and dry. No rashes noted. ? ?Tye Savoy, NP  07/05/2021, 3:28 PM ? ? ? ? ? ? ? ? ? ? ?

## 2021-07-06 ENCOUNTER — Encounter: Payer: Self-pay | Admitting: Nurse Practitioner

## 2021-07-09 NOTE — Progress Notes (Signed)
She is not a candidate for cologaurd given history of adenomatous colon polyps, currently cologaurd is recommended only for average risk patients with no family history of colon cancer or personal history of colon polyps. ?Reviewed and agree with documentation and assessment and plan. ?Iona Beard , MD ? ? ?

## 2021-07-12 ENCOUNTER — Telehealth: Payer: Self-pay | Admitting: Nurse Practitioner

## 2021-07-12 DIAGNOSIS — M5412 Radiculopathy, cervical region: Secondary | ICD-10-CM | POA: Diagnosis not present

## 2021-07-12 NOTE — Telephone Encounter (Signed)
Patient called stating she was supposed to be referred somewhere to have pelvic floor rehab and hasn't heard anymore about it.  Please call patient and advise.  Thank you. ?

## 2021-07-12 NOTE — Telephone Encounter (Signed)
Her AVS has the referral information and was told if she did not get a call from them, then she should call them. Please refer to her AVS: ? ?Instructions ? ?We place placed a referral to AMB referral to rehabilitation ?  ?Where: [facility] ?Address: [id number] [place] [address] ?Phone: [phone].  ?You should hear from their office in the next couple weeks to schedule an appointment. If you do not hear from them please call them at [phone]. ?   ?  ? ?

## 2021-07-24 ENCOUNTER — Ambulatory Visit: Payer: Medicare Other | Admitting: Physical Therapy

## 2021-08-01 ENCOUNTER — Ambulatory Visit: Payer: Medicare Other | Attending: Gastroenterology | Admitting: Physical Therapy

## 2021-08-01 NOTE — Therapy (Deleted)
OUTPATIENT PHYSICAL THERAPY FEMALE PELVIC EVALUATION   Patient Name: Barbara Edwards MRN: [id number] DOB:[birth year], 61 y.o., female Today's Date: 08/01/2021    Past Medical History:  Diagnosis Date   Anemia 06/22/2019   Anxiety    Bipolar disorder ([place])    Depression    Fibromyalgia    Fibromyalgia 11/17/2020   GERD (gastroesophageal reflux disease)    Headache    History of kidney stones    Sleep apnea    Past Surgical History:  Procedure Laterality Date   ABDOMINAL HYSTERECTOMY  2018   partial   CHOLECYSTECTOMY     NO PAST SURGERIES     Patient Active Problem List   Diagnosis Date Noted   Insomnia 11/17/2020   Fibromyalgia 11/17/2020   Connective tissue disease ([place]) 11/17/2020   Anxiety 11/17/2020   Acute pyelonephritis 07/04/2020   Pyelonephritis 07/04/2020   Fever    Osteopenia 08/19/2019   Recurrent urinary tract infection 06/22/2019   Migraine 06/22/2019   Kidney stone 06/22/2019   Irritable bowel syndrome 06/22/2019   Diverticulitis 06/22/2019   Disorder of gallbladder 06/22/2019   Anemia 06/22/2019    PCP: Ernestene Kiel, MD  REFERRING PROVIDER: Mauri Pole, MD  REFERRING DIAG:  R15.9 (ICD-10-CM) - Incontinence of feces, unspecified fecal incontinence type  Z86.010 (ICD-10-CM) - History of colonic polyps    THERAPY DIAG:  No diagnosis found.  ONSET DATE: ***  SUBJECTIVE:                                                                                                                                                                                           SUBJECTIVE STATEMENT: *** Fluid intake: {Yes/No:304960894}   Patient confirms identification and approves PT to assess pelvic floor and treatment {yes/no:20286}   PAIN:  Are you having pain? {yes/no:20286} NPRS scale: ***/10 Pain location: {pelvic pain location:27098}  Pain type: {type:313116} Pain description: {PAIN DESCRIPTION:21022940}   Aggravating factors:  *** Relieving factors: ***  PRECAUTIONS: {Therapy precautions:24002}  WEIGHT BEARING RESTRICTIONS {Yes ***/No:24003}  FALLS:  Has patient fallen in last 6 months? {fallsyesno:27318}  LIVING ENVIRONMENT: Lives with: {OPRC lives with:25569::"lives with their family"} Lives in: {Lives in:25570} Stairs: {opstairs:27293} Has following equipment at home: {Assistive devices:23999}  OCCUPATION: ***  PLOF: {PLOF:24004}  PATIENT GOALS ***  PERTINENT HISTORY:  History of Bipolar, depression, fibromyalgia, GERD, kidney stones, abdominal hysterectomy, diverticulosis Sexual abuse: {Yes/No:304960894}  BOWEL MOVEMENT Pain with bowel movement: {yes/no:20286} Type of bowel movement:{PT BM type:27100} Fully empty rectum: {Yes/No:304960894} Leakage: {Yes/No:304960894} Pads: {Yes/No:304960894} Fiber supplement: {Yes/No:304960894}  URINATION Pain with urination: {yes/no:20286} Fully empty bladder: {Yes/No:304960894} Stream: {PT urination:27102} Urgency: [id number] Frequency: ***  Leakage: {PT leakage:27103} Pads: {Yes/No:304960894}  INTERCOURSE Pain with intercourse: {pain with intercourse PA:27099} Ability to have vaginal penetration:  {Yes/No:304960894} Climax: *** Marinoff Scale: ***/3  PREGNANCY Vaginal deliveries *** Tearing {Yes***/No:304960894} C-section deliveries *** Currently pregnant {Yes***/No:304960894}  PROLAPSE {PT prolapse:27101}    OBJECTIVE:   DIAGNOSTIC FINDINGS:  ***  PATIENT SURVEYS:  {rehab surveys:24030}  PFIQ-7 ***  COGNITION:  Overall cognitive status: {cognition:24006}     SENSATION:  Light touch: {intact/deficits:24005}  Proprioception: {intact/deficits:24005}  MUSCLE LENGTH: Hamstrings: Right *** deg; Left *** deg Thomas test: Right *** deg; Left *** deg  LUMBAR SPECIAL TESTS:  {lumbar special test:25242}  FUNCTIONAL TESTS:  {Functional tests:24029}  GAIT: Distance walked: *** Assistive device utilized: {Assistive  devices:23999} Level of assistance: {Levels of assistance:24026} Comments: ***  POSTURE:  ***  LUMBARAROM/PROM  A/PROM A/PROM  08/01/2021  Flexion   Extension   Right lateral flexion   Left lateral flexion   Right rotation   Left rotation    (Blank rows = not tested)  LE ROM:  {AROM/PROM:27142} ROM Right 08/01/2021 Left 08/01/2021  Hip flexion    Hip extension    Hip abduction    Hip adduction    Hip internal rotation    Hip external rotation    Knee flexion    Knee extension    Ankle dorsiflexion    Ankle plantarflexion    Ankle inversion    Ankle eversion     (Blank rows = not tested)  LE MMT:  MMT Right 08/01/2021 Left 08/01/2021  Hip flexion    Hip extension    Hip abduction    Hip adduction    Hip internal rotation    Hip external rotation    Knee flexion    Knee extension    Ankle dorsiflexion    Ankle plantarflexion    Ankle inversion    Ankle eversion     PELVIC MMT:   MMT  08/01/2021  Vaginal   Internal Anal Sphincter   External Anal Sphincter   Puborectalis   Diastasis Recti   (Blank rows = not tested)        PALPATION:   General  ***                External Perineal Exam ***                             Internal Pelvic Floor ***  TONE: ***  PROLAPSE: ***  TODAY'S TREATMENT  EVAL ***   PATIENT EDUCATION:  Education details: *** Person educated: {Person educated:25204} Education method: {Education Method:25205} Education comprehension: {Education Comprehension:25206}   HOME EXERCISE PROGRAM: ***  ASSESSMENT:  CLINICAL IMPRESSION: Patient is a *** y.o. *** who was seen today for physical therapy evaluation and treatment for ***.    OBJECTIVE IMPAIRMENTS {opptimpairments:25111}.   ACTIVITY LIMITATIONS {activity limitations:25113}.   PERSONAL FACTORS {Personal factors:25162} are also affecting patient's functional outcome.    REHAB POTENTIAL: {rehabpotential:25112}  CLINICAL DECISION MAKING: {clinical decision  making:25114}  EVALUATION COMPLEXITY: {Evaluation complexity:25115}   GOALS: Goals reviewed with patient? {yes/no:20286}  SHORT TERM GOALS: Target date: {follow up:25551}  *** Baseline: Goal status: {GOALSTATUS:25110}  2.  *** Baseline:  Goal status: {GOALSTATUS:25110}  3.  *** Baseline:  Goal status: {GOALSTATUS:25110}  4.  *** Baseline:  Goal status: {GOALSTATUS:25110}  5.  *** Baseline:  Goal status: {GOALSTATUS:25110}  6.  *** Baseline:  Goal status: {GOALSTATUS:25110}  LONG TERM GOALS: Target date: {  follow up:25551}  *** Baseline:  Goal status: {GOALSTATUS:25110}  2.  *** Baseline:  Goal status: {GOALSTATUS:25110}  3.  *** Baseline:  Goal status: {GOALSTATUS:25110}  4.  *** Baseline:  Goal status: {GOALSTATUS:25110}  5.  *** Baseline:  Goal status: {GOALSTATUS:25110}  6.  *** Baseline:  Goal status: {GOALSTATUS:25110}  PLAN: PT FREQUENCY: {rehab frequency:25116}  PT DURATION: {rehab duration:25117}  PLANNED INTERVENTIONS: {rehab planned interventions:25118::"Therapeutic exercises","Therapeutic activity","Neuromuscular re-education","Balance training","Gait training","Patient/Family education","Joint mobilization"}  PLAN FOR NEXT SESSION: ***   Camillo Flaming Hari Casaus, PT 08/01/2021, 8:41 AM

## 2021-08-02 ENCOUNTER — Ambulatory Visit: Payer: Medicare Other | Admitting: Physical Therapy

## 2021-08-02 ENCOUNTER — Telehealth: Payer: Self-pay | Admitting: Nurse Practitioner

## 2021-08-02 NOTE — Telephone Encounter (Signed)
Inbound call from patient would like a call back to discuss the referral. States she would like to be somewhere closer to [place] or even in [place]  ?

## 2021-08-03 NOTE — Telephone Encounter (Signed)
Spoke with Barbara Edwards and she stated she wanted her records faxed to [name] at [facility]. Referral faxed to [phone]. Phone # is [phone] ?

## 2021-08-14 DIAGNOSIS — M6281 Muscle weakness (generalized): Secondary | ICD-10-CM | POA: Diagnosis not present

## 2021-08-16 ENCOUNTER — Ambulatory Visit: Payer: Medicare Other | Admitting: Physical Therapy

## 2021-08-16 DIAGNOSIS — M6281 Muscle weakness (generalized): Secondary | ICD-10-CM | POA: Diagnosis not present

## 2021-08-21 DIAGNOSIS — M5412 Radiculopathy, cervical region: Secondary | ICD-10-CM | POA: Diagnosis not present

## 2021-08-29 DIAGNOSIS — W19XXXA Unspecified fall, initial encounter: Secondary | ICD-10-CM | POA: Diagnosis not present

## 2021-08-29 DIAGNOSIS — N3090 Cystitis, unspecified without hematuria: Secondary | ICD-10-CM | POA: Diagnosis not present

## 2021-08-29 DIAGNOSIS — S5012XA Contusion of left forearm, initial encounter: Secondary | ICD-10-CM | POA: Diagnosis not present

## 2021-08-29 DIAGNOSIS — R300 Dysuria: Secondary | ICD-10-CM | POA: Diagnosis not present

## 2021-09-06 DIAGNOSIS — M6281 Muscle weakness (generalized): Secondary | ICD-10-CM | POA: Diagnosis not present

## 2021-10-11 DIAGNOSIS — S161XXA Strain of muscle, fascia and tendon at neck level, initial encounter: Secondary | ICD-10-CM | POA: Diagnosis not present

## 2021-10-11 DIAGNOSIS — M5412 Radiculopathy, cervical region: Secondary | ICD-10-CM | POA: Diagnosis not present

## 2021-11-15 DIAGNOSIS — R1011 Right upper quadrant pain: Secondary | ICD-10-CM | POA: Diagnosis not present

## 2021-11-15 DIAGNOSIS — N390 Urinary tract infection, site not specified: Secondary | ICD-10-CM | POA: Diagnosis not present

## 2021-11-15 DIAGNOSIS — R1012 Left upper quadrant pain: Secondary | ICD-10-CM | POA: Diagnosis not present

## 2021-11-15 DIAGNOSIS — R21 Rash and other nonspecific skin eruption: Secondary | ICD-10-CM | POA: Diagnosis not present

## 2022-01-03 DIAGNOSIS — Z6829 Body mass index (BMI) 29.0-29.9, adult: Secondary | ICD-10-CM | POA: Diagnosis not present

## 2022-01-03 DIAGNOSIS — Z01419 Encounter for gynecological examination (general) (routine) without abnormal findings: Secondary | ICD-10-CM | POA: Diagnosis not present

## 2022-01-03 DIAGNOSIS — Z1231 Encounter for screening mammogram for malignant neoplasm of breast: Secondary | ICD-10-CM | POA: Diagnosis not present

## 2022-01-10 DIAGNOSIS — H6593 Unspecified nonsuppurative otitis media, bilateral: Secondary | ICD-10-CM | POA: Diagnosis not present

## 2022-01-10 DIAGNOSIS — Z79899 Other long term (current) drug therapy: Secondary | ICD-10-CM | POA: Diagnosis not present

## 2022-01-10 DIAGNOSIS — F419 Anxiety disorder, unspecified: Secondary | ICD-10-CM | POA: Diagnosis not present

## 2022-01-10 DIAGNOSIS — Z13220 Encounter for screening for lipoid disorders: Secondary | ICD-10-CM | POA: Diagnosis not present

## 2022-01-10 DIAGNOSIS — Z131 Encounter for screening for diabetes mellitus: Secondary | ICD-10-CM | POA: Diagnosis not present

## 2022-01-22 DIAGNOSIS — M8580 Other specified disorders of bone density and structure, unspecified site: Secondary | ICD-10-CM | POA: Diagnosis not present

## 2022-02-04 ENCOUNTER — Ambulatory Visit: Payer: Medicare Other | Admitting: Podiatry

## 2022-02-11 ENCOUNTER — Ambulatory Visit: Payer: Medicare Other

## 2022-02-11 ENCOUNTER — Ambulatory Visit: Payer: Medicare Other | Admitting: Podiatry

## 2022-02-11 DIAGNOSIS — M7752 Other enthesopathy of left foot: Secondary | ICD-10-CM

## 2022-02-11 DIAGNOSIS — M25572 Pain in left ankle and joints of left foot: Secondary | ICD-10-CM | POA: Diagnosis not present

## 2022-02-11 DIAGNOSIS — M7672 Peroneal tendinitis, left leg: Secondary | ICD-10-CM

## 2022-02-11 DIAGNOSIS — M778 Other enthesopathies, not elsewhere classified: Secondary | ICD-10-CM | POA: Diagnosis not present

## 2022-02-11 NOTE — Progress Notes (Signed)
Subjective:  Patient ID: Barbara Edwards, female    DOB: 1960-06-09,  MRN: [id number]  Chief Complaint  Patient presents with   Foot Pain    Pain to top left foot radiating to lateral aspect of foot. Using compression sock.     61 y.o. female presents pain on the outside of her left foot.  She says this is been going on for couple weeks.  She does a lot of walking.  She noticed that recently she started having increasing pain on the outside of the left foot which is prohibiting her from walking.  She has not really tried much yet in the way of treatment for this.  She is using compression sock which does appear to be helping the pain.  Past Medical History:  Diagnosis Date   Anemia 06/22/2019   Anxiety    Bipolar disorder ([place])    Depression    Fibromyalgia    Fibromyalgia 11/17/2020   GERD (gastroesophageal reflux disease)    Headache    History of kidney stones    Sleep apnea     Allergies  Allergen Reactions   Prednisone Hives   Sulfa Antibiotics Diarrhea and Nausea Only    ROS: Negative except as per HPI above  Objective:  General: AAO x3, NAD  Dermatological: With inspection and palpation of the right and left lower extremities there are no open sores, no preulcerative lesions, no rash or signs of infection present. Nails are of normal length thickness and coloration.   Vascular:  Dorsalis Pedis artery and Posterior Tibial artery pedal pulses are 2/4 bilateral.  Capillary fill time < 3 sec to all digits.   Neruologic: Grossly intact via light touch bilateral. Protective threshold intact to all sites bilateral.   Musculoskeletal: Pain with palpation at the lateral aspect of the rear foot along the calcaneus and peroneal tendons distally near the insertion of the peroneus brevis and inferior to the lateral malleolus.  Pain with a specific bony prominence of the lateral aspect of the calcaneus possible enlarged peroneal tubercle.  Gait: Unassisted, Nonantalgic.   No  images are attached to the encounter.  Radiographs:  Date: 02/11/2022 XR left foot weightbearing AP/Lateral/Oblique   Findings: Attention directed to the lateral foot and rear foot there is no acute osseous abnormality no fracture identified no discrete osseous spurring identified though difficult to determine based on overlap. Assessment:   1. Bone spur of left foot   2. Peroneal tendinitis of left lower extremity   3. Acute left ankle pain   4. Capsulitis of foot, left      Plan:  Patient was evaluated and treated and all questions answered.  #Large peroneal tubercle left foot, possible peroneal tendinitis left lower extremity -I discussed with the patient that I believe that her left lateral referred pain is likely related to a bone spur present on the outside of her calcaneal body.  There is a corresponding location that is tender to palpation that does feel like an osseous prominence. -Conservatively we can proceed with anti-inflammatory medication she is already taking meloxicam.  Also recommend Voltaren gel. -I also recommended a steroid injection which the patient agreed to.  After prep with isopropyl alcohol, an injection of 1 cc half percent Marcaine plain mixed with 1 cc Kenalog 10 was injected about the peroneal tubercle and peroneal tendons of the left foot.  Band-Aid was applied patient tolerated the procedure well. -I discussed with the patient that if she is not improved with conservative  therapy we may have to proceed with an MRI of the left foot to evaluate the peroneal tendons further as well as evaluate the peroneal tubercle -Discussed that surgery may be indicated to resect the prominent bone spur laterally  Return in about 4 weeks (around 03/11/2022) for Follow-up bone spur left lateral rear foot.          Corinna Gab, DPM [facility]

## 2022-03-05 ENCOUNTER — Ambulatory Visit (INDEPENDENT_AMBULATORY_CARE_PROVIDER_SITE_OTHER): Payer: Medicare Other | Admitting: Podiatry

## 2022-03-05 DIAGNOSIS — Z91199 Patient's noncompliance with other medical treatment and regimen due to unspecified reason: Secondary | ICD-10-CM

## 2022-03-05 NOTE — Progress Notes (Signed)
Pt was a no show for apt, charge generated 

## 2022-03-15 DIAGNOSIS — R5383 Other fatigue: Secondary | ICD-10-CM | POA: Diagnosis not present

## 2022-03-15 DIAGNOSIS — M25512 Pain in left shoulder: Secondary | ICD-10-CM | POA: Diagnosis not present

## 2022-03-15 DIAGNOSIS — Z6831 Body mass index (BMI) 31.0-31.9, adult: Secondary | ICD-10-CM | POA: Diagnosis not present

## 2022-03-15 DIAGNOSIS — E559 Vitamin D deficiency, unspecified: Secondary | ICD-10-CM | POA: Diagnosis not present

## 2022-03-19 ENCOUNTER — Ambulatory Visit: Payer: Medicare Other | Admitting: Podiatry

## 2022-04-05 ENCOUNTER — Other Ambulatory Visit: Payer: Self-pay | Admitting: Radiology

## 2022-05-09 DIAGNOSIS — H52223 Regular astigmatism, bilateral: Secondary | ICD-10-CM | POA: Diagnosis not present

## 2022-05-09 DIAGNOSIS — H524 Presbyopia: Secondary | ICD-10-CM | POA: Diagnosis not present

## 2022-05-09 DIAGNOSIS — M5412 Radiculopathy, cervical region: Secondary | ICD-10-CM | POA: Diagnosis not present

## 2022-05-09 DIAGNOSIS — H40013 Open angle with borderline findings, low risk, bilateral: Secondary | ICD-10-CM | POA: Diagnosis not present

## 2022-05-14 DIAGNOSIS — M5412 Radiculopathy, cervical region: Secondary | ICD-10-CM | POA: Diagnosis not present

## 2022-05-16 DIAGNOSIS — M5090 Cervical disc disorder, unspecified, unspecified cervical region: Secondary | ICD-10-CM | POA: Diagnosis not present

## 2022-05-16 DIAGNOSIS — M5412 Radiculopathy, cervical region: Secondary | ICD-10-CM | POA: Diagnosis not present

## 2022-05-16 DIAGNOSIS — M7542 Impingement syndrome of left shoulder: Secondary | ICD-10-CM | POA: Diagnosis not present

## 2022-05-28 DIAGNOSIS — M4802 Spinal stenosis, cervical region: Secondary | ICD-10-CM | POA: Diagnosis not present

## 2022-05-28 DIAGNOSIS — M542 Cervicalgia: Secondary | ICD-10-CM | POA: Diagnosis not present

## 2022-05-28 DIAGNOSIS — M5412 Radiculopathy, cervical region: Secondary | ICD-10-CM | POA: Diagnosis not present

## 2022-05-30 DIAGNOSIS — M5090 Cervical disc disorder, unspecified, unspecified cervical region: Secondary | ICD-10-CM | POA: Diagnosis not present

## 2022-05-30 DIAGNOSIS — M5412 Radiculopathy, cervical region: Secondary | ICD-10-CM | POA: Diagnosis not present

## 2022-05-30 DIAGNOSIS — M7542 Impingement syndrome of left shoulder: Secondary | ICD-10-CM | POA: Diagnosis not present

## 2022-06-07 ENCOUNTER — Telehealth: Payer: Self-pay | Admitting: *Deleted

## 2022-06-07 DIAGNOSIS — K209 Esophagitis, unspecified without bleeding: Secondary | ICD-10-CM

## 2022-06-07 MED ORDER — LANSOPRAZOLE 30 MG PO CPDR: DELAYED_RELEASE_CAPSULE | ORAL | 2 refills | Status: DC

## 2022-06-07 NOTE — Telephone Encounter (Signed)
Refill request from pharmacy by fax, sent refill electronically but pt needs a follow up appointment

## 2022-07-04 DIAGNOSIS — G43909 Migraine, unspecified, not intractable, without status migrainosus: Secondary | ICD-10-CM | POA: Diagnosis not present

## 2022-07-04 DIAGNOSIS — R319 Hematuria, unspecified: Secondary | ICD-10-CM | POA: Diagnosis not present

## 2022-07-04 DIAGNOSIS — E041 Nontoxic single thyroid nodule: Secondary | ICD-10-CM | POA: Diagnosis not present

## 2022-07-04 DIAGNOSIS — R5383 Other fatigue: Secondary | ICD-10-CM | POA: Diagnosis not present

## 2022-07-04 DIAGNOSIS — M545 Low back pain, unspecified: Secondary | ICD-10-CM | POA: Diagnosis not present

## 2022-07-04 DIAGNOSIS — N390 Urinary tract infection, site not specified: Secondary | ICD-10-CM | POA: Diagnosis not present

## 2022-07-04 DIAGNOSIS — R42 Dizziness and giddiness: Secondary | ICD-10-CM | POA: Diagnosis not present

## 2022-07-08 ENCOUNTER — Other Ambulatory Visit: Payer: Self-pay | Admitting: Obstetrics and Gynecology

## 2022-07-08 DIAGNOSIS — E041 Nontoxic single thyroid nodule: Secondary | ICD-10-CM

## 2022-07-22 DIAGNOSIS — M5412 Radiculopathy, cervical region: Secondary | ICD-10-CM | POA: Diagnosis not present

## 2022-08-05 DIAGNOSIS — M5412 Radiculopathy, cervical region: Secondary | ICD-10-CM | POA: Diagnosis not present

## 2022-08-08 ENCOUNTER — Other Ambulatory Visit: Payer: Medicare Other

## 2022-08-15 DIAGNOSIS — M7062 Trochanteric bursitis, left hip: Secondary | ICD-10-CM | POA: Diagnosis not present

## 2022-08-20 DIAGNOSIS — R300 Dysuria: Secondary | ICD-10-CM | POA: Diagnosis not present

## 2022-08-20 DIAGNOSIS — J069 Acute upper respiratory infection, unspecified: Secondary | ICD-10-CM | POA: Diagnosis not present

## 2022-08-20 DIAGNOSIS — R0981 Nasal congestion: Secondary | ICD-10-CM | POA: Diagnosis not present

## 2022-08-27 DIAGNOSIS — M7062 Trochanteric bursitis, left hip: Secondary | ICD-10-CM | POA: Diagnosis not present

## 2022-08-27 DIAGNOSIS — M62551 Muscle wasting and atrophy, not elsewhere classified, right thigh: Secondary | ICD-10-CM | POA: Diagnosis not present

## 2022-08-27 DIAGNOSIS — M25551 Pain in right hip: Secondary | ICD-10-CM | POA: Diagnosis not present

## 2022-08-27 DIAGNOSIS — M25552 Pain in left hip: Secondary | ICD-10-CM | POA: Diagnosis not present

## 2022-08-29 DIAGNOSIS — M25551 Pain in right hip: Secondary | ICD-10-CM | POA: Diagnosis not present

## 2022-08-29 DIAGNOSIS — M25552 Pain in left hip: Secondary | ICD-10-CM | POA: Diagnosis not present

## 2022-08-29 DIAGNOSIS — M7062 Trochanteric bursitis, left hip: Secondary | ICD-10-CM | POA: Diagnosis not present

## 2022-08-29 DIAGNOSIS — M62551 Muscle wasting and atrophy, not elsewhere classified, right thigh: Secondary | ICD-10-CM | POA: Diagnosis not present

## 2022-09-03 DIAGNOSIS — M25552 Pain in left hip: Secondary | ICD-10-CM | POA: Diagnosis not present

## 2022-09-03 DIAGNOSIS — M62551 Muscle wasting and atrophy, not elsewhere classified, right thigh: Secondary | ICD-10-CM | POA: Diagnosis not present

## 2022-09-03 DIAGNOSIS — M25551 Pain in right hip: Secondary | ICD-10-CM | POA: Diagnosis not present

## 2022-09-03 DIAGNOSIS — M7062 Trochanteric bursitis, left hip: Secondary | ICD-10-CM | POA: Diagnosis not present

## 2022-09-05 ENCOUNTER — Ambulatory Visit
Admission: RE | Admit: 2022-09-05 | Discharge: 2022-09-05 | Disposition: A | Discharge status: Home / Self Care | Payer: Medicare Other | Source: Ambulatory Visit | Attending: Obstetrics and Gynecology | Admitting: Obstetrics and Gynecology

## 2022-09-05 DIAGNOSIS — M25551 Pain in right hip: Secondary | ICD-10-CM | POA: Diagnosis not present

## 2022-09-05 DIAGNOSIS — E041 Nontoxic single thyroid nodule: Secondary | ICD-10-CM

## 2022-09-05 DIAGNOSIS — M25552 Pain in left hip: Secondary | ICD-10-CM | POA: Diagnosis not present

## 2022-09-05 DIAGNOSIS — M7062 Trochanteric bursitis, left hip: Secondary | ICD-10-CM | POA: Diagnosis not present

## 2022-09-05 DIAGNOSIS — M62551 Muscle wasting and atrophy, not elsewhere classified, right thigh: Secondary | ICD-10-CM | POA: Diagnosis not present

## 2022-09-10 DIAGNOSIS — M25551 Pain in right hip: Secondary | ICD-10-CM | POA: Diagnosis not present

## 2022-09-10 DIAGNOSIS — M62551 Muscle wasting and atrophy, not elsewhere classified, right thigh: Secondary | ICD-10-CM | POA: Diagnosis not present

## 2022-09-10 DIAGNOSIS — M7062 Trochanteric bursitis, left hip: Secondary | ICD-10-CM | POA: Diagnosis not present

## 2022-09-10 DIAGNOSIS — M25552 Pain in left hip: Secondary | ICD-10-CM | POA: Diagnosis not present

## 2022-09-17 DIAGNOSIS — M62551 Muscle wasting and atrophy, not elsewhere classified, right thigh: Secondary | ICD-10-CM | POA: Diagnosis not present

## 2022-09-17 DIAGNOSIS — M7062 Trochanteric bursitis, left hip: Secondary | ICD-10-CM | POA: Diagnosis not present

## 2022-09-17 DIAGNOSIS — M25552 Pain in left hip: Secondary | ICD-10-CM | POA: Diagnosis not present

## 2022-09-17 DIAGNOSIS — M25551 Pain in right hip: Secondary | ICD-10-CM | POA: Diagnosis not present

## 2022-10-17 ENCOUNTER — Ambulatory Visit: Payer: Medicare Other | Admitting: Family Medicine

## 2022-10-17 ENCOUNTER — Telehealth: Payer: Self-pay | Admitting: Adult Health

## 2022-10-17 NOTE — Telephone Encounter (Signed)
Pt was a no show for a NP appt with Dr Janee Morn on 10/17/22, I did not send a letter, I did block her from scheduling.   10/17/22 - pt called at 10.50am to say she forgot her appt. She claimed she didn't get a reminder. Pt wants to reschedule.

## 2022-10-18 NOTE — Telephone Encounter (Signed)
Please offer pt 1x reschedule. Please verify phone # we have on file for text and phone reminders.

## 2022-11-22 ENCOUNTER — Encounter: Payer: Self-pay | Admitting: Family Medicine

## 2022-11-22 ENCOUNTER — Ambulatory Visit (INDEPENDENT_AMBULATORY_CARE_PROVIDER_SITE_OTHER): Payer: Medicare Other | Admitting: Family Medicine

## 2022-11-22 VITALS — BP 110/72 | HR 79 | Temp 97.4°F | Ht 67.0 in | Wt 196.6 lb

## 2022-11-22 DIAGNOSIS — R519 Headache, unspecified: Secondary | ICD-10-CM | POA: Diagnosis not present

## 2022-11-22 DIAGNOSIS — N390 Urinary tract infection, site not specified: Secondary | ICD-10-CM

## 2022-11-22 DIAGNOSIS — M545 Low back pain, unspecified: Secondary | ICD-10-CM | POA: Diagnosis not present

## 2022-11-22 DIAGNOSIS — G4733 Obstructive sleep apnea (adult) (pediatric): Secondary | ICD-10-CM

## 2022-11-22 DIAGNOSIS — R252 Cramp and spasm: Secondary | ICD-10-CM

## 2022-11-22 DIAGNOSIS — F331 Major depressive disorder, recurrent, moderate: Secondary | ICD-10-CM | POA: Insufficient documentation

## 2022-11-22 DIAGNOSIS — F5105 Insomnia due to other mental disorder: Secondary | ICD-10-CM

## 2022-11-22 DIAGNOSIS — F99 Mental disorder, not otherwise specified: Secondary | ICD-10-CM

## 2022-11-22 DIAGNOSIS — F419 Anxiety disorder, unspecified: Secondary | ICD-10-CM

## 2022-11-22 LAB — POC URINALSYSI DIPSTICK (AUTOMATED)
Bilirubin, UA: NEGATIVE
Blood, UA: NEGATIVE
Glucose, UA: NEGATIVE
Leukocytes, UA: NEGATIVE
Nitrite, UA: NEGATIVE
Protein, UA: NEGATIVE
Spec Grav, UA: >=1.03 — AB (ref 1.010–1.025)
Urobilinogen, UA: 0.2 E.U./dL
pH, UA: 5 (ref 5.0–8.0)

## 2022-11-22 MED ORDER — B COMPLEX VITAMINS PO CAPS: 1.0000 | ORAL_CAPSULE | Freq: Every day | ORAL | Status: AC

## 2022-11-22 MED ORDER — SUMATRIPTAN SUCCINATE 50 MG PO TABS: 50.0000 mg | ORAL_TABLET | ORAL | 0 refills | Status: DC | PRN

## 2022-11-22 NOTE — Assessment & Plan Note (Signed)
Chronic urinary discomfort with a history of severe infection.  UA negative Plan:  Await urine culture results Patient to follow-up with her urologist in [facility] Consider urogynecologist referral if indicated by urology findings

## 2022-11-22 NOTE — Assessment & Plan Note (Signed)
Frequent, severe headaches suggestive of migraines  Differential diagnosis:  Migraine headaches Cervicogenic headaches Cluster headaches  Plan:  Prescribe sumatriptan to be taken at the onset of headaches Refer to neurology for further evaluation and management Monitor for headache frequency and severity

## 2022-11-22 NOTE — Progress Notes (Signed)
Assessment/Plan:   Problem List Items Addressed This Visit       Respiratory   OSA (obstructive sleep apnea)     Genitourinary   Recurrent UTI (urinary tract infection)    Chronic urinary discomfort with a history of severe infection.  UA negative Plan:  Await urine culture results Patient to follow-up with her urologist in [place] Consider urogynecologist referral if indicated by urology findings      Relevant Orders   POCT Urinalysis Dipstick (Automated) (Completed)   Urine Culture     Other   Insomnia   Anxiety   Leg cramps    Trial B complex and high potassium foods such as mustard or bananas      Relevant Medications   MAGNESIUM PO   Ascorbic Acid (VITAMIN C PO)   VITAMIN D PO   b complex vitamins capsule   Moderate episode of recurrent major depressive disorder (HCC)    Long-term management with escitalopram, potentially inadequate Anxiety and insomnia managed with diazepam currently  Plan: Educate about potential side effects of long-term benzodiazepine use and risks associated with certain treatments. Discuss alternative pharmacotherapy for anxiety and sleep disturbances at follow-up  Consider tapering and eventual discontinuation protocol for diazepam Schedule follow-up visit to re-evaluate mental health management      Nonintractable episodic headache    Frequent, severe headaches suggestive of migraines  Differential diagnosis:  Migraine headaches Cervicogenic headaches Cluster headaches  Plan:  Prescribe sumatriptan to be taken at the onset of headaches Refer to neurology for further evaluation and management Monitor for headache frequency and severity      Relevant Medications   meloxicam (MOBIC) 15 MG tablet   SUMAtriptan (IMITREX) 50 MG tablet   Other Relevant Orders   Ambulatory referral to Neurology   Low back pain - Primary   Relevant Medications   meloxicam (MOBIC) 15 MG tablet    Medications Discontinued During This  Encounter  Medication Reason   estradiol (ESTRACE) 0.5 MG tablet     Return in about 4 weeks (around 12/20/2022) for headaches.    Subjective:   Encounter date: 11/22/2022  Barbara Edwards is a 62 y.o. female who has Pyelonephritis; Fever; Recurrent urinary tract infection; Osteopenia; Migraine; Kidney stone; Irritable bowel syndrome; Insomnia; Fibromyalgia; Diverticulitis; Disorder of gallbladder; Connective tissue disease (HCC); Anxiety; Anemia; Recurrent UTI (urinary tract infection); Leg cramps; Moderate episode of recurrent major depressive disorder (HCC); OSA (obstructive sleep apnea); Nonintractable episodic headache; and Low back pain on their problem list..   She  has a past medical history of Acute pyelonephritis (07/04/2020), Anemia (06/22/2019), Anxiety, Arthritis (2018), Bipolar disorder (HCC), Chronic kidney disease (2015), Depression, Fibromyalgia, Fibromyalgia (11/17/2020), GERD (gastroesophageal reflux disease), Headache, History of kidney stones, Osteoporosis, and Sleep apnea..   Chief Complaint: Urinary discomfort and frequent headaches.  History of Present Illness: Urinary Issues: Barbara Edwards has a long-standing history of urinary discomfort, starting even before having children. Two years ago, she almost went septic due to a severe urinary infection. She currently experiences urinary leakage and lower back pain, which she associates with her kidneys or bladder. Despite often clear in-office urine tests, cultures frequently reveal E. Coli infections.   Headaches: Barbara Edwards reports daily or near-daily headaches since a "heat stroke" on July 17th, characterized by substantial discomfort necessitating rest in a dark room. These headaches, which she suspects are either migraines or cluster headaches, are exacerbated by stress, light exposure, and heat. She sometimes takes Excedrin with variable effectiveness.  Depression and Anxiety: Barbara Edwards  has been on antidepressants since 1983,  switching from Prozac to escitalopram (Lexapro), which she feels may no longer be effective. She takes diazepam for sleep due to persistent anxiety and racing thoughts but acknowledges concern over its long-term use and seeks alternative treatment options.  Chronic Pain and Orthopedic Issues: Barbara Edwards has a history of cervical radiculopathy and tendonitis in both elbows, which were treated with injections and physical therapy. She complains of generalized joint pain, particularly outside of water activities, and takes meloxicam as needed which helps with her nightly pain.  Leg cramps Patient develops leg cramps at night. Improved on Magnesium, but does not tolerate due to diarrhea.   Miscellaneous: Barbara Edwards has had unexplained skin lesions, which she attributes to a possible collagen problem or vitamin K deficiency. She recently discontinued taking estradiol on the advice of her gynecologist due to associated stroke risk risks. She also reported having a tooth abscess treated with an unknown antibiotic and concerns of stool incontinence, feeling a colonoscopy might be needed.     11/22/2022    3:33 PM  Depression screen PHQ 2/9  Decreased Interest 1  Down, Depressed, Hopeless 1  PHQ - 2 Score 2  Altered sleeping 3  Tired, decreased energy 1  Change in appetite 1  Feeling bad or failure about yourself  0  Trouble concentrating 1  Moving slowly or fidgety/restless 1  Suicidal thoughts 0  PHQ-9 Score 9  Difficult doing work/chores Somewhat difficult      11/22/2022    3:33 PM  GAD 7 : Generalized Anxiety Score  Nervous, Anxious, on Edge 1  Control/stop worrying 2  Worry too much - different things 2  Trouble relaxing 2  Restless 2  Easily annoyed or irritable 2  Afraid - awful might happen 2  Total GAD 7 Score 13  Anxiety Difficulty Very difficult      Review of Systems  Eyes:  Positive for photophobia.  Gastrointestinal:  Positive for diarrhea (occassional).  Genitourinary:   Positive for dysuria, frequency and urgency.  Musculoskeletal:  Positive for back pain, joint pain, myalgias and neck pain.  Skin:  Positive for rash.  Neurological:  Positive for headaches.  Endo/Heme/Allergies:  Bruises/bleeds easily.  Psychiatric/Behavioral:  Positive for depression and memory loss. Negative for suicidal ideas. The patient is nervous/anxious and has insomnia.   All other systems reviewed and are negative.   Past Surgical History:  Procedure Laterality Date   ABDOMINAL HYSTERECTOMY  2018   partial   APPENDECTOMY     CHOLECYSTECTOMY     NO PAST SURGERIES     TUBAL LIGATION  1990    Outpatient Medications Prior to Visit  Medication Sig Dispense Refill   Ascorbic Acid (VITAMIN C PO) Take by mouth.     diazepam (VALIUM) 10 MG tablet Take 10 mg by mouth at bedtime as needed for sleep.     escitalopram (LEXAPRO) 10 MG tablet Take 10 mg by mouth daily.     lansoprazole (PREVACID) 30 MG capsule Take on an empty stomach 30 minutes before breakfast 30 capsule 2   MAGNESIUM PO Take by mouth.     meloxicam (MOBIC) 15 MG tablet 15 mg.     VITAMIN D PO Take by mouth.     estradiol (ESTRACE) 0.5 MG tablet Take 1 tablet by mouth once daily. 90 tablet 0   No facility-administered medications prior to visit.    Family History  Problem Relation Age of Onset   Anxiety disorder Mother  Arthritis Mother    Depression Mother    Kidney disease Mother    Alcohol abuse Father    Asthma Brother    Early death Brother    Heart disease Brother    Diabetes Maternal Aunt    Heart disease Brother    Colon cancer Neg Hx    Colon polyps Neg Hx     Social History   Socioeconomic History   Marital status: Single    Spouse name: Windy Fast   Number of children: Not on file   Years of education: Not on file   Highest education level: 12th grade  Occupational History   Occupation: unemployed  Tobacco Use   Smoking status: Former    Average packs/day: 0.5 packs/day for 10.0  years (5.0 ttl pk-yrs)    Types: Cigarettes    Passive exposure: Never   Smokeless tobacco: Never  Vaping Use   Vaping status: Never Used  Substance and Sexual Activity   Alcohol use: Not Currently    Comment: occasional    Drug use: Never   Sexual activity: Yes    Birth control/protection: Post-menopausal    Comment: estradoil   Other Topics Concern   Not on file  Social History Narrative   Lives alone   Right Handed   Drinks 1 cup caffeine daily   Social Determinants of Health   Financial Resource Strain: Not on file  Food Insecurity: Not on file  Transportation Needs: Not on file  Physical Activity: Not on file  Stress: Not on file  Social Connections: Not on file  Intimate Partner Violence: Not on file                                                                                                  Objective:  Physical Exam: BP 110/72 (BP Location: Left Arm, Patient Position: Sitting, Cuff Size: Large)   Pulse 79   Temp (!) 97.4 F (36.3 C) (Temporal)   Ht 5\' 7"  (1.702 m)   Wt 196 lb 9.6 oz (89.2 kg)   SpO2 98%   BMI 30.79 kg/m     Physical Exam Constitutional:      General: She is not in acute distress.    Appearance: Normal appearance. She is not ill-appearing or toxic-appearing.  HENT:     Head: Normocephalic and atraumatic.     Nose: Nose normal. No congestion.  Eyes:     General: No scleral icterus.    Extraocular Movements: Extraocular movements intact.  Cardiovascular:     Rate and Rhythm: Normal rate and regular rhythm.     Pulses: Normal pulses.     Heart sounds: Normal heart sounds.  Pulmonary:     Effort: Pulmonary effort is normal. No respiratory distress.     Breath sounds: Normal breath sounds.  Abdominal:     General: Abdomen is flat. Bowel sounds are normal.     Palpations: Abdomen is soft.  Musculoskeletal:        General: Normal range of motion.  Lymphadenopathy:     Cervical: No cervical adenopathy.  Skin:  General: Skin  is warm and dry.     Findings: No rash.  Neurological:     General: No focal deficit present.     Mental Status: She is alert and oriented to person, place, and time. Mental status is at baseline.  Psychiatric:        Mood and Affect: Mood is anxious. Affect is tearful.        Speech: Speech is not tangential.        Behavior: Behavior is cooperative.        Thought Content: Thought content does not include suicidal ideation.     US THYROID  Result Date: 09/05/2022 CLINICAL DATA:  62 year old female with thyroid nodules EXAM: THYROID ULTRASOUND TECHNIQUE: Ultrasound examination of the thyroid gland and adjacent soft tissues was performed. COMPARISON:  None Available. FINDINGS: Parenchymal Echotexture: Normal Isthmus: 0.3 cm Right lobe: 4.7 cm x 1.1 cm x 1.8 cm Left lobe: 4.0 cm x 1.1 cm x 1.5 cm _________________________________________________________ Estimated total number of nodules >/= 1 cm: 0 Number of spongiform nodules >/=  2 cm not described below (TR1): 0 Number of mixed cystic and solid nodules >/= 1.5 cm not described below (TR2): 0 _________________________________________________________ Nodule # 1: Location: Right; Mid Maximum size: 0.6 cm; Other 2 dimensions: 0.4 cm x 0.3 cm Composition: cannot determine (2) Echogenicity: hypoechoic (2) Shape: not taller-than-wide (0) Margins: ill-defined (0) Echogenic foci: none (0) ACR TI-RADS total points: 4. ACR TI-RADS risk category: TR4 (4-6 points). ACR TI-RADS recommendations: No surveillance _________________________________________________________ No adenopathy IMPRESSION: No thyroid nodule meets criteria for biopsy or surveillance, as designated by the newly established ACR TI-RADS criteria. Recommendations follow those established by the new ACR TI-RADS criteria (J Am Coll Radiol 2017;14:587-595). Electronically Signed   By: Gilmer Mor D.O.   On: 09/05/2022 15:19    Recent Results (from the past 2160 hour(s))  POCT Urinalysis Dipstick  (Automated)     Status: Abnormal   Collection Time: 11/22/22  3:04 PM  Result Value Ref Range   Color, UA yellow    Clarity, UA clear    Glucose, UA Negative Negative   Bilirubin, UA neg    Ketones, UA eng    Spec Grav, UA >=1.030 (A) 1.010 - 1.025   Blood, UA neg    pH, UA 5.0 5.0 - 8.0   Protein, UA Negative Negative   Urobilinogen, UA 0.2 0.2 or 1.0 E.U./dL   Nitrite, UA neg    Leukocytes, UA Negative Negative        Garner Nash, MD, MS

## 2022-11-22 NOTE — Assessment & Plan Note (Addendum)
Long-term management with escitalopram, potentially inadequate Anxiety and insomnia managed with diazepam currently  Plan: Educate about potential side effects of long-term benzodiazepine use and risks associated with certain treatments. Discuss alternative pharmacotherapy for anxiety and sleep disturbances at follow-up  Consider tapering and eventual discontinuation protocol for diazepam Schedule follow-up visit to re-evaluate mental health management

## 2022-11-22 NOTE — Assessment & Plan Note (Signed)
Trial B complex and high potassium foods such as mustard or bananas

## 2022-11-23 LAB — URINE CULTURE
MICRO NUMBER:: 15342111
SPECIMEN QUALITY:: ADEQUATE

## 2022-11-25 ENCOUNTER — Telehealth: Payer: Self-pay | Admitting: Adult Health

## 2022-11-25 DIAGNOSIS — G43009 Migraine without aura, not intractable, without status migrainosus: Secondary | ICD-10-CM

## 2022-11-25 MED ORDER — RIZATRIPTAN BENZOATE 10 MG PO TABS: 10.0000 mg | ORAL_TABLET | ORAL | 0 refills | Status: DC | PRN

## 2022-11-25 NOTE — Telephone Encounter (Signed)
Caller Name: Cadia Call back phone #: [phone]  Reason for Call: Pt was scribed a med for her migraines She stated that the med did not help at all and was told to call if it didn't. She would like something else.  SUMAtriptan (IMITREX) 50 MG tablet [[id number]] is the med [facility] - [place], [place] - [address], [place] [address] Phone: [phone]  Fax: [phone]

## 2022-11-25 NOTE — Addendum Note (Signed)
Addended by: Fanny Bien B on: 11/25/2022 03:59 PM   Modules accepted: Orders

## 2022-11-26 ENCOUNTER — Telehealth: Payer: Self-pay | Admitting: Family Medicine

## 2022-11-26 NOTE — Telephone Encounter (Signed)
Left patient a detailed voice message with annotation below and to return call to office with any concerns  

## 2022-11-26 NOTE — Telephone Encounter (Signed)
Pt said she is returning your call . Please call her back

## 2022-11-26 NOTE — Telephone Encounter (Signed)
Left patient a detailed voice message to return call to office.   

## 2022-11-26 NOTE — Telephone Encounter (Signed)
Duplicate encounter

## 2022-12-20 ENCOUNTER — Ambulatory Visit: Payer: Medicare Other | Admitting: Family Medicine

## 2022-12-24 ENCOUNTER — Telehealth: Payer: Self-pay | Admitting: Family Medicine

## 2022-12-24 ENCOUNTER — Ambulatory Visit: Payer: Medicare Other | Admitting: Family Medicine

## 2022-12-24 NOTE — Telephone Encounter (Signed)
Pt called in at 12:15 for a 2:20 OV with Dr Janee Morn, her car is in the shop. I did send a letter.

## 2022-12-24 NOTE — Telephone Encounter (Signed)
1st missed visit, car in shop

## 2022-12-31 DIAGNOSIS — M25511 Pain in right shoulder: Secondary | ICD-10-CM | POA: Diagnosis not present

## 2023-01-06 DIAGNOSIS — R350 Frequency of micturition: Secondary | ICD-10-CM | POA: Diagnosis not present

## 2023-01-06 DIAGNOSIS — Z1321 Encounter for screening for nutritional disorder: Secondary | ICD-10-CM | POA: Diagnosis not present

## 2023-01-06 DIAGNOSIS — Z13220 Encounter for screening for lipoid disorders: Secondary | ICD-10-CM | POA: Diagnosis not present

## 2023-01-06 DIAGNOSIS — Z131 Encounter for screening for diabetes mellitus: Secondary | ICD-10-CM | POA: Diagnosis not present

## 2023-01-06 DIAGNOSIS — Z1329 Encounter for screening for other suspected endocrine disorder: Secondary | ICD-10-CM | POA: Diagnosis not present

## 2023-01-06 DIAGNOSIS — E7800 Pure hypercholesterolemia, unspecified: Secondary | ICD-10-CM | POA: Diagnosis not present

## 2023-01-06 DIAGNOSIS — E041 Nontoxic single thyroid nodule: Secondary | ICD-10-CM | POA: Diagnosis not present

## 2023-01-06 DIAGNOSIS — Z1231 Encounter for screening mammogram for malignant neoplasm of breast: Secondary | ICD-10-CM | POA: Diagnosis not present

## 2023-01-06 LAB — BASIC METABOLIC PANEL
BUN: 19 (ref 4–21)
CO2: 25 — AB (ref 13–22)
Chloride: 102 (ref 99–108)
Creatinine: 1.1 (ref 0.5–1.1)
Glucose: 91
Potassium: 4.5 meq/L (ref 3.5–5.1)
Sodium: 140 (ref 137–147)

## 2023-01-06 LAB — LIPID PANEL
Cholesterol: 247 — AB (ref 0–200)
HDL: 95 — AB (ref 35–70)
LDL Cholesterol: 142
Triglycerides: 62 (ref 40–160)

## 2023-01-06 LAB — COMPREHENSIVE METABOLIC PANEL
Albumin: 4.1 (ref 3.5–5.0)
Calcium: 9.5 (ref 8.7–10.7)
Globulin: 2.6
eGFR: 57

## 2023-01-06 LAB — HEPATIC FUNCTION PANEL
ALT: 19 U/L (ref 7–35)
AST: 18 (ref 13–35)
Alkaline Phosphatase: 93 (ref 25–125)

## 2023-01-06 LAB — TSH: TSH: 1.7 (ref 0.41–5.90)

## 2023-01-06 LAB — CBC AND DIFFERENTIAL
HCT: 42 (ref 36–46)
Hemoglobin: 13 (ref 12.0–16.0)
WBC: 7.9

## 2023-01-06 LAB — VITAMIN D 25 HYDROXY (VIT D DEFICIENCY, FRACTURES): Vit D, 25-Hydroxy: 44.4

## 2023-01-06 LAB — CBC: RBC: 4.2 (ref 3.87–5.11)

## 2023-01-06 LAB — HM MAMMOGRAPHY

## 2023-01-06 LAB — HEMOGLOBIN A1C: Hemoglobin A1C: 5.1

## 2023-01-08 ENCOUNTER — Ambulatory Visit (INDEPENDENT_AMBULATORY_CARE_PROVIDER_SITE_OTHER): Payer: Medicare Other | Admitting: Family Medicine

## 2023-01-08 VITALS — BP 124/82 | HR 70 | Temp 97.6°F | Wt 199.8 lb

## 2023-01-08 DIAGNOSIS — Z8249 Family history of ischemic heart disease and other diseases of the circulatory system: Secondary | ICD-10-CM

## 2023-01-08 DIAGNOSIS — E66811 Obesity, class 1: Secondary | ICD-10-CM

## 2023-01-08 DIAGNOSIS — R7303 Prediabetes: Secondary | ICD-10-CM | POA: Diagnosis not present

## 2023-01-08 DIAGNOSIS — R519 Headache, unspecified: Secondary | ICD-10-CM

## 2023-01-08 DIAGNOSIS — Z87891 Personal history of nicotine dependence: Secondary | ICD-10-CM | POA: Insufficient documentation

## 2023-01-08 DIAGNOSIS — E6609 Other obesity due to excess calories: Secondary | ICD-10-CM

## 2023-01-08 DIAGNOSIS — E785 Hyperlipidemia, unspecified: Secondary | ICD-10-CM | POA: Diagnosis not present

## 2023-01-08 DIAGNOSIS — Z23 Encounter for immunization: Secondary | ICD-10-CM | POA: Diagnosis not present

## 2023-01-08 DIAGNOSIS — Z6831 Body mass index (BMI) 31.0-31.9, adult: Secondary | ICD-10-CM

## 2023-01-08 DIAGNOSIS — R0602 Shortness of breath: Secondary | ICD-10-CM

## 2023-01-08 HISTORY — DX: Prediabetes: R73.03

## 2023-01-08 HISTORY — DX: Hyperlipidemia, unspecified: E78.5

## 2023-01-08 HISTORY — DX: Shortness of breath: R06.02

## 2023-01-08 MED ORDER — ALBUTEROL SULFATE HFA 108 (90 BASE) MCG/ACT IN AERS: 2.0000 | INHALATION_SPRAY | Freq: Four times a day (QID) | RESPIRATORY_TRACT | 0 refills | Status: DC | PRN

## 2023-01-08 NOTE — Progress Notes (Signed)
Assessment/Plan:   Problem List Items Addressed This Visit       Other   Nonintractable episodic headache - Primary    Improved with saline irrigation and OTC allergy medication. Continue current allergy management. Follow-up if recurrence or worsening occurs.       Family history of heart failure in brother   Relevant Orders   Ambulatory referral to Cardiology   Shortness of breath    Likely due to deconditioning or mild COPD. Plan to test albuterol before exercise. Referral to cardiology for stress testing and further assessment.      Relevant Medications   albuterol (VENTOLIN HFA) 108 (90 Base) MCG/ACT inhaler   Other Relevant Orders   Ambulatory referral to Cardiology   Former smoker   Prediabetes    Recommend increasing physical activity and implementing dietary changes. Consideration of metformin upon reevaluation if needed.      Hyperlipidemia    Recently diagnosed, exploring lifestyle modifications. Await complete lab results before pharmacotherapy initiation.      Class 1 obesity due to excess calories without serious comorbidity with body mass index (BMI) of 31.0 to 31.9 in adult   Other Visit Diagnoses     Immunization due           There are no discontinued medications.  Return if symptoms worsen or fail to improve.    Subjective:   Encounter date: 01/08/2023  Barbara Edwards is a 61 y.o. female who has Pyelonephritis; Fever; Recurrent urinary tract infection; Osteopenia; Migraine; Kidney stone; Irritable bowel syndrome; Insomnia; Fibromyalgia; Diverticulitis; Disorder of gallbladder; Connective tissue disease (HCC); Anxiety; Anemia; Recurrent UTI (urinary tract infection); Leg cramps; Moderate episode of recurrent major depressive disorder (HCC); OSA (obstructive sleep apnea); Nonintractable episodic headache; Low back pain; Family history of heart failure in brother; Shortness of breath; Former smoker; Prediabetes; Hyperlipidemia; and Class 1  obesity due to excess calories without serious comorbidity with body mass index (BMI) of 31.0 to 31.9 in adult on their problem list..   She  has a past medical history of Acute pyelonephritis (07/04/2020), Anemia (06/22/2019), Anxiety, Arthritis (2018), Bipolar disorder (HCC), Chronic kidney disease (2015), Depression, Fibromyalgia, Fibromyalgia (11/17/2020), GERD (gastroesophageal reflux disease), Headache, History of kidney stones, Hyperlipidemia (01/08/2023), Osteoporosis, Prediabetes (01/08/2023), Shortness of breath (01/08/2023), and Sleep apnea..   Chief Complaint: Follow-up for headaches, high cholesterol, and prediabetes. Concerns about shortness of breath and discussion of potential colonoscopy options.  History of Present Illness: The patient presents for a 4-week follow-up focusing on the management of chronic issues. Reports improvement in headaches since using a saline nasal solution and an over-the-counter allergy medication, although rizatriptan was ineffective. Associates headaches with possible allergies.  Recently diagnosed with high cholesterol and prediabetes following blood work performed by her OB/GYN and outside institution.  Patient does not have lab work today and does not know the specifics.  She was advised to follow-up with her PCP.  Experiences shortness of breath and fatigue, particularly on exertion like walking uphill or lap swimming. A significant family history of congestive heart failure exists, with the patient's brother having died from it at 75. Reports mild chest pain when ascending stairs but denies leg swelling.  Review of Systems  Constitutional:  Positive for malaise/fatigue.  Respiratory:  Positive for shortness of breath and wheezing.   Cardiovascular:  Positive for chest pain. Negative for palpitations and leg swelling.  Neurological:  Positive for headaches.  All other systems reviewed and are negative.   Past Surgical History:  Procedure Laterality  Date   ABDOMINAL HYSTERECTOMY  2018   partial   APPENDECTOMY     CHOLECYSTECTOMY     NO PAST SURGERIES     TUBAL LIGATION  1990    Outpatient Medications Prior to Visit  Medication Sig Dispense Refill   Ascorbic Acid (VITAMIN C PO) Take by mouth.     b complex vitamins capsule Take 1 capsule by mouth daily.     diazepam (VALIUM) 10 MG tablet Take 10 mg by mouth at bedtime as needed for sleep.     escitalopram (LEXAPRO) 10 MG tablet Take 10 mg by mouth daily.     lansoprazole (PREVACID) 30 MG capsule Take on an empty stomach 30 minutes before breakfast 30 capsule 2   MAGNESIUM PO Take by mouth.     meloxicam (MOBIC) 15 MG tablet 15 mg.     rizatriptan (MAXALT) 10 MG tablet Take 1 tablet (10 mg total) by mouth as needed for migraine. May repeat in 2 hours if needed 10 tablet 0   VITAMIN D PO Take by mouth.     No facility-administered medications prior to visit.    Family History  Problem Relation Age of Onset   Anxiety disorder Mother    Arthritis Mother    Depression Mother    Kidney disease Mother    Alcohol abuse Father    Asthma Brother    Early death Brother    Heart disease Brother    Diabetes Maternal Aunt    Heart disease Brother    Colon cancer Neg Hx    Colon polyps Neg Hx     Social History   Socioeconomic History   Marital status: Single    Spouse name: Windy Fast   Number of children: Not on file   Years of education: Not on file   Highest education level: Some college, no degree  Occupational History   Occupation: unemployed  Tobacco Use   Smoking status: Former    Current packs/day: 0.50    Average packs/day: 0.5 packs/day for 25.0 years (12.5 ttl pk-yrs)    Types: Cigarettes    Passive exposure: Never   Smokeless tobacco: Never  Vaping Use   Vaping status: Never Used  Substance and Sexual Activity   Alcohol use: Not Currently    Comment: occasional    Drug use: Never   Sexual activity: Yes    Birth control/protection: Post-menopausal     Comment: estradoil   Other Topics Concern   Not on file  Social History Narrative   Lives alone   Right Handed   Drinks 1 cup caffeine daily   Social Determinants of Health   Financial Resource Strain: Patient Declined (01/08/2023)   Overall Financial Resource Strain (CARDIA)    Difficulty of Paying Living Expenses: Patient declined  Food Insecurity: Patient Declined (01/08/2023)   Hunger Vital Sign    Worried About Running Out of Food in the Last Year: Patient declined    Ran Out of Food in the Last Year: Patient declined  Transportation Needs: Patient Declined (01/08/2023)   PRAPARE - Administrator, Civil Service (Medical): Patient declined    Lack of Transportation (Non-Medical): Patient declined  Physical Activity: Sufficiently Active (01/08/2023)   Exercise Vital Sign    Days of Exercise per Week: 4 days    Minutes of Exercise per Session: 150+ min  Stress: Stress Concern Present (01/08/2023)   [employer] of Occupational Health - Occupational Stress Questionnaire  Feeling of Stress : Very much  Social Connections: Moderately Isolated (01/08/2023)   Social Connection and Isolation Panel [NHANES]    Frequency of Communication with Friends and Family: Once a week    Frequency of Social Gatherings with Friends and Family: Once a week    Attends Religious Services: More than 4 times per year    Active Member of [employer] or Organizations: Yes    Attends Engineer, structural: More than 4 times per year    Marital Status: Divorced  Catering manager Violence: Not on file                                                                                                  Objective:  Physical Exam: BP 124/82 (BP Location: Right Arm, Patient Position: Sitting, Cuff Size: Large)   Pulse 70   Temp 97.6 F (36.4 C) (Temporal)   Wt 199 lb 12.8 oz (90.6 kg)   SpO2 97%   BMI 31.29 kg/m   Wt Readings from Last 3 Encounters:  01/08/23 199 lb 12.8 oz (90.6 kg)   11/22/22 196 lb 9.6 oz (89.2 kg)  07/05/21 187 lb (84.8 kg)     Physical Exam Constitutional:      General: She is not in acute distress.    Appearance: Normal appearance. She is not ill-appearing or toxic-appearing.  HENT:     Head: Normocephalic and atraumatic.     Nose: Nose normal. No congestion.  Eyes:     General: No scleral icterus.    Extraocular Movements: Extraocular movements intact.  Cardiovascular:     Rate and Rhythm: Normal rate and regular rhythm.     Pulses: Normal pulses.     Heart sounds: Normal heart sounds.  Pulmonary:     Effort: Pulmonary effort is normal. No respiratory distress.     Breath sounds: Normal breath sounds.  Abdominal:     General: Abdomen is flat. Bowel sounds are normal.     Palpations: Abdomen is soft.  Musculoskeletal:        General: Normal range of motion.  Lymphadenopathy:     Cervical: No cervical adenopathy.  Skin:    General: Skin is warm and dry.     Findings: No rash.  Neurological:     General: No focal deficit present.     Mental Status: She is alert and oriented to person, place, and time. Mental status is at baseline.  Psychiatric:        Behavior: Behavior normal.        Thought Content: Thought content normal.     No results found.  Recent Results (from the past 2160 hour(s))  POCT Urinalysis Dipstick (Automated)     Status: Abnormal   Collection Time: 11/22/22  3:04 PM  Result Value Ref Range   Color, UA yellow    Clarity, UA clear    Glucose, UA Negative Negative   Bilirubin, UA neg    Ketones, UA eng    Spec Grav, UA >=1.030 (A) 1.010 - 1.025   Blood, UA neg    pH, UA  5.0 5.0 - 8.0   Protein, UA Negative Negative   Urobilinogen, UA 0.2 0.2 or 1.0 E.U./dL   Nitrite, UA neg    Leukocytes, UA Negative Negative  Urine Culture     Status: None   Collection Time: 11/22/22  4:02 PM   Specimen: Urine  Result Value Ref Range   MICRO NUMBER: [id number]    SPECIMEN QUALITY: Adequate    Sample Source NOT  GIVEN    STATUS: FINAL    Result:      Less than 10,000 CFU/mL of single Gram positive organism isolated. No further testing will be performed. If clinically indicated, recollection using a method to minimize contamination, with prompt transfer to Urine Culture Transport Tube, is recommended.        Garner Nash, MD, MS

## 2023-01-09 LAB — HM PAP SMEAR
HM Pap smear: NEGATIVE
HPV, high-risk: NEGATIVE

## 2023-01-10 NOTE — Assessment & Plan Note (Signed)
Recommend increasing physical activity and implementing dietary changes. Consideration of metformin upon reevaluation if needed.

## 2023-01-10 NOTE — Assessment & Plan Note (Signed)
Recently diagnosed, exploring lifestyle modifications. Await complete lab results before pharmacotherapy initiation.

## 2023-01-10 NOTE — Assessment & Plan Note (Signed)
Likely due to deconditioning or mild COPD. Plan to test albuterol before exercise. Referral to cardiology for stress testing and further assessment.

## 2023-01-10 NOTE — Assessment & Plan Note (Signed)
Improved with saline irrigation and OTC allergy medication. Continue current allergy management. Follow-up if recurrence or worsening occurs.

## 2023-01-13 ENCOUNTER — Telehealth: Payer: Self-pay | Admitting: Family Medicine

## 2023-01-13 DIAGNOSIS — R0602 Shortness of breath: Secondary | ICD-10-CM

## 2023-01-13 MED ORDER — ALBUTEROL SULFATE HFA 108 (90 BASE) MCG/ACT IN AERS: 2.0000 | INHALATION_SPRAY | Freq: Four times a day (QID) | RESPIRATORY_TRACT | 0 refills | Status: DC | PRN

## 2023-01-13 NOTE — Telephone Encounter (Signed)
Chart supports rx. Last OV: 01/08/2023

## 2023-01-13 NOTE — Telephone Encounter (Signed)
Pharmacy change request  Pt expressed that her pharmacy is   [facility] [address] - [place], [place] - [address] [place], [place] [address] Phone: [phone]  Fax: [phone]   She needs the   albuterol (VENTOLIN HFA) 108 (90 Base) MCG/ACT inhaler [[id number]]  Sent ti this pharmacy and wants all her meds sent to this one.

## 2023-01-21 DIAGNOSIS — Z713 Dietary counseling and surveillance: Secondary | ICD-10-CM | POA: Diagnosis not present

## 2023-02-04 ENCOUNTER — Telehealth: Payer: Self-pay | Admitting: Family Medicine

## 2023-02-04 NOTE — Telephone Encounter (Signed)
Prescription Request  02/04/2023  LOV: 01/08/2023  What is the name of the medication or equipment? diazepam (VALIUM) 10 MG tablet [[id number]]   Have you contacted your pharmacy to request a refill? No   Which pharmacy would you like this sent to?  [facility] [address] - [place], [place] - [address] [place] [address] Phone: [phone] Fax: [phone]    Patient notified that their request is being sent to the clinical staff for review and that they should receive a response within 2 business days.   Please advise at Mobile [phone] (mobile)

## 2023-02-05 ENCOUNTER — Other Ambulatory Visit: Payer: Self-pay | Admitting: *Deleted

## 2023-02-05 DIAGNOSIS — K209 Esophagitis, unspecified without bleeding: Secondary | ICD-10-CM

## 2023-02-05 MED ORDER — LANSOPRAZOLE 30 MG PO CPDR: DELAYED_RELEASE_CAPSULE | ORAL | 2 refills | Status: DC

## 2023-02-06 ENCOUNTER — Telehealth: Payer: Self-pay | Admitting: Family Medicine

## 2023-02-06 NOTE — Telephone Encounter (Signed)
Pt called with concerns about Dr Janee Morn. She stated that she did not feel like there was good communication, that he did not give eye contact.  She wanted to  transfer within our office. While waiting for KO's response she disconnected the call.  I called her back and she let me know that she decided to go to another office to prevent the tension that might cause.

## 2023-02-11 NOTE — Telephone Encounter (Signed)
Removed Dr. Janee Morn as PCP

## 2023-02-16 ENCOUNTER — Encounter (HOSPITAL_BASED_OUTPATIENT_CLINIC_OR_DEPARTMENT_OTHER): Payer: Self-pay | Admitting: Emergency Medicine

## 2023-02-16 ENCOUNTER — Ambulatory Visit (HOSPITAL_BASED_OUTPATIENT_CLINIC_OR_DEPARTMENT_OTHER)
Admission: EM | Admit: 2023-02-16 | Discharge: 2023-02-16 | Disposition: A | Discharge status: Home / Self Care | Payer: Medicare Other | Attending: Internal Medicine | Admitting: Internal Medicine

## 2023-02-16 DIAGNOSIS — Z87440 Personal history of urinary (tract) infections: Secondary | ICD-10-CM | POA: Diagnosis not present

## 2023-02-16 DIAGNOSIS — J0140 Acute pansinusitis, unspecified: Secondary | ICD-10-CM | POA: Insufficient documentation

## 2023-02-16 DIAGNOSIS — N390 Urinary tract infection, site not specified: Secondary | ICD-10-CM | POA: Diagnosis not present

## 2023-02-16 DIAGNOSIS — T3695XA Adverse effect of unspecified systemic antibiotic, initial encounter: Secondary | ICD-10-CM | POA: Diagnosis not present

## 2023-02-16 DIAGNOSIS — Z87442 Personal history of urinary calculi: Secondary | ICD-10-CM | POA: Diagnosis not present

## 2023-02-16 DIAGNOSIS — R350 Frequency of micturition: Secondary | ICD-10-CM | POA: Diagnosis present

## 2023-02-16 DIAGNOSIS — B379 Candidiasis, unspecified: Secondary | ICD-10-CM

## 2023-02-16 DIAGNOSIS — R519 Headache, unspecified: Secondary | ICD-10-CM

## 2023-02-16 LAB — POCT URINALYSIS DIP (MANUAL ENTRY)
Bilirubin, UA: NEGATIVE
Blood, UA: NEGATIVE
Glucose, UA: NEGATIVE mg/dL
Ketones, POC UA: NEGATIVE mg/dL
Leukocytes, UA: NEGATIVE
Nitrite, UA: POSITIVE — AB
Protein Ur, POC: NEGATIVE mg/dL
Spec Grav, UA: 1.025 (ref 1.010–1.025)
Urobilinogen, UA: 0.2 U/dL
pH, UA: 5.5 (ref 5.0–8.0)

## 2023-02-16 MED ORDER — FLUCONAZOLE 150 MG PO TABS: 150.0000 mg | ORAL_TABLET | ORAL | 0 refills | Status: DC

## 2023-02-16 MED ORDER — KETOROLAC TROMETHAMINE 30 MG/ML IJ SOLN
30.0000 mg | Freq: Once | INTRAMUSCULAR | Status: AC
  Administered 2023-02-16: 30 mg via INTRAMUSCULAR

## 2023-02-16 MED ORDER — AMOXICILLIN-POT CLAVULANATE 875-125 MG PO TABS: 1.0000 | ORAL_TABLET | Freq: Two times a day (BID) | ORAL | 0 refills | Status: DC

## 2023-02-16 MED ORDER — BENZONATATE 100 MG PO CAPS: 100.0000 mg | ORAL_CAPSULE | Freq: Three times a day (TID) | ORAL | 0 refills | Status: DC

## 2023-02-16 MED ORDER — ACETAMINOPHEN 325 MG PO TABS
975.0000 mg | ORAL_TABLET | Freq: Once | ORAL | Status: AC
  Administered 2023-02-16: 975 mg via ORAL

## 2023-02-16 NOTE — ED Triage Notes (Signed)
Pt reports for about week having sinus congestion, cough and headache. Took sinus medications that is not clearing it up.   Pt reports having urinary urgency and foul smell that has been ongoing for several weeks. Reports has Hx UTIs that show up on cultures not UAs. Tried taking AZO and water. Pt also has back pains.

## 2023-02-16 NOTE — ED Provider Notes (Addendum)
[facility]    CSN: [id number] Arrival date & time: 02/16/23  1205      History   Chief Complaint Chief Complaint  Patient presents with   Cough   Nasal Congestion   Urinary Urgency    HPI Barbara Edwards is a 62 y.o. female.   Barbara Edwards is a 62 y.o. female presenting for chief complaint of Cough, nasal congestion, and headache that started 7-8 days ago. Headache is localized to the frontal aspect of the head including the forehead and the bilateral cheeks/jaw. Headaches is currently a 7/10 and worsened by bright lights and loud noises. History of migraine headaches and has appointment with neurology soon for this. Denies vision changes, paresthesias to the extremities, and extremity weakness. She attributes headache to significant nasal congestion. Cough is minimally productive with light green mucus.  Denies SOB, CP, palpitations, N/V/D.  No history of asthma or bronchitis, has albuterol inhaler as needed for shortness of breath due to history of cigarette use.  Former smoker, quit 2 years ago. Tylenol does not help with headache. Taking allergy medications without relief of nasal congestion.   Additionally reports urinary frequency, bilateral lower back pain, and urinary odor that started 1 week ago. Denies dysuria, N/V/D, abdominal pain, flank pain, fevers, and frequent intake of urinary irritants.  She does not take an SGLT2 inhibitor, denies vaginal symptoms, denies gross hematuria. No recent antibiotic use. States she has recurrent UTIs and this feels similar. Taking AZO and drinking water without relief.    Cough   Past Medical History:  Diagnosis Date   Acute pyelonephritis 07/04/2020   Anemia 06/22/2019   Anxiety    Arthritis 2018   Bipolar disorder (HCC)    Chronic kidney disease 2015   Depression    Fibromyalgia    Fibromyalgia 11/17/2020   GERD (gastroesophageal reflux disease)    Headache    History of kidney stones    Hyperlipidemia 01/08/2023    Osteoporosis    Prediabetes 01/08/2023   Shortness of breath 01/08/2023   Sleep apnea     Patient Active Problem List   Diagnosis Date Noted   Family history of heart failure in brother 01/08/2023   Shortness of breath 01/08/2023   Former smoker 01/08/2023   Prediabetes 01/08/2023   Hyperlipidemia 01/08/2023   Class 1 obesity due to excess calories without serious comorbidity with body mass index (BMI) of 31.0 to 31.9 in adult 01/08/2023   Recurrent UTI (urinary tract infection) 11/22/2022   Leg cramps 11/22/2022   Moderate episode of recurrent major depressive disorder (HCC) 11/22/2022   OSA (obstructive sleep apnea) 11/22/2022   Nonintractable episodic headache 11/22/2022   Low back pain 11/22/2022   Insomnia 11/17/2020   Fibromyalgia 11/17/2020   Connective tissue disease (HCC) 11/17/2020   Anxiety 11/17/2020   Pyelonephritis 07/04/2020   Fever    Osteopenia 08/19/2019   Recurrent urinary tract infection 06/22/2019   Migraine 06/22/2019   Kidney stone 06/22/2019   Irritable bowel syndrome 06/22/2019   Diverticulitis 06/22/2019   Disorder of gallbladder 06/22/2019   Anemia 06/22/2019    Past Surgical History:  Procedure Laterality Date   ABDOMINAL HYSTERECTOMY  2018   partial   APPENDECTOMY     CHOLECYSTECTOMY     NO PAST SURGERIES     TUBAL LIGATION  1990    OB History   No obstetric history on file.      Home Medications    Prior to Admission medications  Medication Sig Start Date End Date Taking? Authorizing Provider  amoxicillin-clavulanate (AUGMENTIN) 875-125 MG tablet Take 1 tablet by mouth every 12 (twelve) hours. 02/16/23  Yes Carlisle Beers, FNP  benzonatate (TESSALON) 100 MG capsule Take 1 capsule (100 mg total) by mouth every 8 (eight) hours. 02/16/23  Yes Carlisle Beers, FNP  fluconazole (DIFLUCAN) 150 MG tablet Take 1 tablet (150 mg total) by mouth every 3 (three) days. 02/16/23  Yes Carlisle Beers, FNP  phentermine 15  MG capsule Take 1 capsule by mouth daily. 02/11/23  Yes [provider]  albuterol (VENTOLIN HFA) 108 (90 Base) MCG/ACT inhaler Inhale 2 puffs into the lungs every 6 (six) hours as needed for wheezing or shortness of breath. 01/13/23   Garnette Gunner, MD  Ascorbic Acid (VITAMIN C PO) Take by mouth.    [provider]  b complex vitamins capsule Take 1 capsule by mouth daily. 11/22/22   Garnette Gunner, MD  diazepam (VALIUM) 10 MG tablet Take 10 mg by mouth at bedtime as needed for sleep. 06/14/20   [provider]  escitalopram (LEXAPRO) 10 MG tablet Take 10 mg by mouth daily. 05/24/20   [provider]  lansoprazole (PREVACID) 30 MG capsule Take on an empty stomach 30 minutes before breakfast 02/05/23   Nandigam, Eleonore Chiquito, MD  MAGNESIUM PO Take by mouth.    [provider]  meloxicam (MOBIC) 15 MG tablet 15 mg.    [provider]  rizatriptan (MAXALT) 10 MG tablet Take 1 tablet (10 mg total) by mouth as needed for migraine. May repeat in 2 hours if needed 11/25/22   Garnette Gunner, MD  VITAMIN D PO Take by mouth.    [provider]    Family History Family History  Problem Relation Age of Onset   Anxiety disorder Mother    Arthritis Mother    Depression Mother    Kidney disease Mother    Alcohol abuse Father    Asthma Brother    Early death Brother    Heart disease Brother    Diabetes Maternal Aunt    Heart disease Brother    Colon cancer Neg Hx    Colon polyps Neg Hx     Social History Social History   Tobacco Use   Smoking status: Former    Current packs/day: 0.50    Average packs/day: 0.5 packs/day for 25.0 years (12.5 ttl pk-yrs)    Types: Cigarettes    Passive exposure: Never   Smokeless tobacco: Never  Vaping Use   Vaping status: Never Used  Substance Use Topics   Alcohol use: Not Currently    Comment: occasional    Drug use: Never     Allergies   Sulfa antibiotics   Review of Systems Review  of Systems  Respiratory:  Positive for cough.   Per HPI  Physical Exam Triage Vital Signs ED Triage Vitals  Encounter Vitals Group     BP 02/16/23 1223 119/79     Systolic BP Percentile --      Diastolic BP Percentile --      Pulse Rate 02/16/23 1223 79     Resp 02/16/23 1223 17     Temp 02/16/23 1223 98 F (36.7 C)     Temp Source 02/16/23 1223 Oral     SpO2 02/16/23 1223 98 %     Weight --      Height --      Head Circumference --  Peak Flow --      Pain Score 02/16/23 1221 7     Pain Loc --      Pain Education --      Exclude from Growth Chart --    No data found.  Updated Vital Signs BP 119/79 (BP Location: Right Arm)   Pulse 79   Temp 98 F (36.7 C) (Oral)   Resp 17   SpO2 98%   Visual Acuity Right Eye Distance:   Left Eye Distance:   Bilateral Distance:    Right Eye Near:   Left Eye Near:    Bilateral Near:     Physical Exam Vitals and nursing note reviewed.  Constitutional:      Appearance: She is not ill-appearing or toxic-appearing.  HENT:     Head: Normocephalic and atraumatic.     Right Ear: Hearing, tympanic membrane, ear canal and external ear normal.     Left Ear: Hearing, tympanic membrane, ear canal and external ear normal.     Nose: Nose normal.     Mouth/Throat:     Lips: Pink.     Mouth: Mucous membranes are moist.  Eyes:     General: Lids are normal. Vision grossly intact. Gaze aligned appropriately.     Extraocular Movements: Extraocular movements intact.     Conjunctiva/sclera: Conjunctivae normal.  Pulmonary:     Effort: Pulmonary effort is normal.  Musculoskeletal:     Cervical back: Neck supple.  Lymphadenopathy:     Cervical: No cervical adenopathy.  Skin:    General: Skin is warm and dry.     Capillary Refill: Capillary refill takes less than 2 seconds.     Findings: No rash.  Neurological:     General: No focal deficit present.     Mental Status: She is alert and oriented to person, place, and time. Mental status  is at baseline.     Cranial Nerves: Cranial nerves 2-12 are intact. No dysarthria or facial asymmetry.     Sensory: Sensation is intact.     Motor: Motor function is intact.     Coordination: Coordination is intact.     Gait: Gait is intact.     Comments: Strength and sensation intact to bilateral upper and lower extremities (5/5). Moves all 4 extremities with normal coordination voluntarily. Non-focal neuro exam.  Psychiatric:        Mood and Affect: Mood normal.        Speech: Speech normal.        Behavior: Behavior normal.        Thought Content: Thought content normal.        Judgment: Judgment normal.      UC Treatments / Results  Labs (all labs ordered are listed, but only abnormal results are displayed) Labs Reviewed  POCT URINALYSIS DIP (MANUAL ENTRY) - Abnormal; Notable for the following components:      Result Value   Clarity, UA cloudy (*)    Nitrite, UA Positive (*)    All other components within normal limits  URINE CULTURE    EKG   Radiology No results found.  Procedures Procedures (including critical care time)  Medications Ordered in UC Medications  ketorolac (TORADOL) 30 MG/ML injection 30 mg (30 mg Intramuscular Given 02/16/23 1308)  acetaminophen (TYLENOL) tablet 975 mg (975 mg Oral Given 02/16/23 1307)    Initial Impression / Assessment and Plan / UC Course  I have reviewed the triage vital signs and the nursing notes.  Pertinent labs & imaging results that were available during my care of the patient were reviewed by me and considered in my medical decision making (see chart for details).   1. Urinary frequency, acute non-recurrent pansinusitis Presentation is consistent with acute postviral bacterial sinusitis.   Symptoms have been present for greater than 8 days and have not responded well to over-the-counter therapies, therefore may have antibiotic.  Diflucan sent as she reports history of antibiotic induced yeast vaginitis.  Deferred  imaging of the chest based on stable cardiopulmonary exam and hemodynamically stable vital signs. Recommend warm compresses to the sinuses as needed. Reviewed recent CMP showing stable renal function, will treat headache in clinic with ketorolac 30mg  IM and tylenol 975mg .  Urinalysis shows cloudy nitrite positive urine. She reports history of UTI found on urine culture and not screening urinalysis in clinic, will therefore culture urine and wait to treat for UTI once culture returns if clinically indicated. Low suspicion for pyelonephritis, nephrolithiasis.   Counseled patient on potential for adverse effects with medications prescribed/recommended today, strict ER and return-to-clinic precautions discussed, patient verbalized understanding.    Final Clinical Impressions(s) / UC Diagnoses   Final diagnoses:  Urinary frequency  Acute non-recurrent pansinusitis  Bad headache  Antibiotic-induced yeast infection     Discharge Instructions      Your evaluation shows you have a bacterial sinus infection. This is likely the cause of your headache. I gave you a shot of ketorolac in the clinic today, this is a strong antiinflammatory medicine.  The antibiotic I sent to the pharmacy can cover UTI as well. I have sent your urine for culture and we will call you if the urine culture shows a urinary tract infection. We may need to change up your antibiotic or add on an antibiotic to treat the urinary tract infection as this is typically caused by different bacteria than sinus infections.  - Take antibiotic sent to pharmacy as directed to treat sinus infection. - Purchase Mucinex over the counter and take this every 12 hours as needed for nasal congestion and cough. - Cough medicines as needed. - Warm compresses to the cheeks and forehead as needed to help with sinus headaches as well as tylenol as needed.  If you develop any new or worsening symptoms or if your symptoms do not start to improve,  please return here or follow-up with your primary care provider. If your symptoms are severe, please go to the emergency room.     ED Prescriptions     Medication Sig Dispense Auth. Provider   amoxicillin-clavulanate (AUGMENTIN) 875-125 MG tablet Take 1 tablet by mouth every 12 (twelve) hours. 14 tablet Reita May M, FNP   benzonatate (TESSALON) 100 MG capsule Take 1 capsule (100 mg total) by mouth every 8 (eight) hours. 21 capsule Reita May M, FNP   fluconazole (DIFLUCAN) 150 MG tablet Take 1 tablet (150 mg total) by mouth every 3 (three) days. 2 tablet Carlisle Beers, FNP      PDMP not reviewed this encounter.    Carlisle Beers, [place] 02/16/23 1335

## 2023-02-16 NOTE — Discharge Instructions (Addendum)
Your evaluation shows you have a bacterial sinus infection. This is likely the cause of your headache. I gave you a shot of ketorolac in the clinic today, this is a strong antiinflammatory medicine.  The antibiotic I sent to the pharmacy can cover UTI as well. I have sent your urine for culture and we will call you if the urine culture shows a urinary tract infection. We may need to change up your antibiotic or add on an antibiotic to treat the urinary tract infection as this is typically caused by different bacteria than sinus infections.  - Take antibiotic sent to pharmacy as directed to treat sinus infection. - Purchase Mucinex over the counter and take this every 12 hours as needed for nasal congestion and cough. - Cough medicines as needed. - Warm compresses to the cheeks and forehead as needed to help with sinus headaches as well as tylenol as needed.  If you develop any new or worsening symptoms or if your symptoms do not start to improve, please return here or follow-up with your primary care provider. If your symptoms are severe, please go to the emergency room.

## 2023-02-18 LAB — URINE CULTURE: Culture: >=100000 — AB

## 2023-02-19 ENCOUNTER — Encounter: Payer: Self-pay | Admitting: Neurology

## 2023-02-19 ENCOUNTER — Ambulatory Visit: Payer: Medicare Other | Admitting: Neurology

## 2023-02-19 DIAGNOSIS — G43009 Migraine without aura, not intractable, without status migrainosus: Secondary | ICD-10-CM | POA: Diagnosis not present

## 2023-02-19 MED ORDER — RIZATRIPTAN BENZOATE 10 MG PO TABS: 10.0000 mg | ORAL_TABLET | ORAL | 11 refills | Status: AC | PRN

## 2023-02-19 MED ORDER — ROSUVASTATIN CALCIUM 20 MG PO TABS: 20.0000 mg | ORAL_TABLET | Freq: Every day | ORAL | 3 refills | Status: DC

## 2023-02-19 MED ORDER — AMITRIPTYLINE HCL 50 MG PO TABS: 50.0000 mg | ORAL_TABLET | Freq: Every day | ORAL | 0 refills | Status: DC

## 2023-02-19 NOTE — Patient Instructions (Addendum)
Start Amitriptyline 25 mg nightly for one week then increase to 50 mg nightly Start Maxalt as needed for headaches, can take second dose 2 hours after the first dose if headaches not improved Sample of Nurtec given to patient to take as abortive medication Will start patient on Crestor for hyperlipidemia Continue your other medications Continue follow-up PCP Return in 6 months or sooner if worse.

## 2023-02-19 NOTE — Progress Notes (Signed)
[facility]  PATIENT: Barbara Edwards DOB: 02-Apr-1961  REQUESTING CLINICIAN: Garnette Gunner, MD HISTORY FROM: Patient  REASON FOR VISIT: Migraines headaches    HISTORICAL  CHIEF COMPLAINT:  Chief Complaint  Patient presents with   New Patient (Initial Visit)    Rm 13, Np migraines, 16 migraines days monthly, current;y taking maxalt    HISTORY OF PRESENT ILLNESS:  Discussed the use of AI scribe software for clinical note transcription with the patient, who gave verbal consent to proceed.  History of Present Illness   The patient is  62 year old woman with a history of migraines, insomnia, anxiety, depression, PTSD, Fibromyalgia and high cholesterol who is presents with complaints of increase frequency of her severe headache, Patient reports a few days ago she presented to the hospital due to headaches, but was given a shot of an unknown medication and 975mg  of Tylenol which resolved the headaches. At that time, she was also diagnosed with urinary tract infection. The patient believes the headache was a migraine, as there were no associated sinus issues. The patient also reports insomnia, which has been exacerbated by the discontinuation of Valium, previously prescribed by a series of primary care physicians. The patient has been using Valium to aid sleep but has been unable to secure a refill from the current primary care physician.  The patient's migraines are typically located on the right temple, described as intense, stabbing, and pulsating. These headaches are often accompanied by sensitivity to loud noises and an inability to function normally. The patient reports feeling nauseous during these episodes but has not vomited. The patient also experiences auras, specifically eye throbbing, before the onset of a headache. The patient has a family history of headaches. The patient has been using Maxalt to manage these headaches, which has been effective. Currently  she is having 3 to 4 headaches days a week.  The patient also reports a recent increase in cholesterol levels, with a total cholesterol of 247. The patient has been advised to see a nutritionist and has been recommended to start on Ozempic, but the patient has expressed concerns about the cost of the medication. The patient has been exercising regularly to manage weight and cholesterol levels.      Headache History and Characteristics: Location: Right temple  Quality:  stabbing pulsating Intensity: 8/10.  Duration: Can last the entire day  Migrainous Features: Photophobia, phonophobia, nausea.  Aura: No  History of brain injury or tumor: No  Family history: Mother  Motion sickness: no Cardiac history: no  OTC: tylenol Sleep: Not good  Mood/ Stress:   Prior prophylaxis: Propranolol: No  Verapamil:No TCA: No Topamax: No Depakote: No Effexor: No Cymbalta: No Neurontin:No  Prior abortives: Triptan: Yes, Maxalt  Anti-emetic: No Steroids: No Ergotamine suppository: No    OTHER MEDICAL CONDITIONS: Anxiety/Depression, Fibromyalgia, Chronic migraines, Insomnia    REVIEW OF SYSTEMS: Full 14 system review of systems performed and negative with exception of: As noted in the HPI  ALLERGIES: Allergies  Allergen Reactions   Sulfa Antibiotics Diarrhea and Nausea Only    HOME MEDICATIONS: Outpatient Medications Prior to Visit  Medication Sig Dispense Refill   albuterol (VENTOLIN HFA) 108 (90 Base) MCG/ACT inhaler Inhale 2 puffs into the lungs every 6 (six) hours as needed for wheezing or shortness of breath. 25.5 g 0   amoxicillin-clavulanate (AUGMENTIN) 875-125 MG tablet Take 1 tablet by mouth every 12 (twelve) hours. 14 tablet 0   Ascorbic Acid (VITAMIN C PO) Take  by mouth.     b complex vitamins capsule Take 1 capsule by mouth daily.     benzonatate (TESSALON) 100 MG capsule Take 1 capsule (100 mg total) by mouth every 8 (eight) hours. 21 capsule 0   escitalopram  (LEXAPRO) 10 MG tablet Take 10 mg by mouth daily.     fluconazole (DIFLUCAN) 150 MG tablet Take 1 tablet (150 mg total) by mouth every 3 (three) days. 2 tablet 0   lansoprazole (PREVACID) 30 MG capsule Take on an empty stomach 30 minutes before breakfast 30 capsule 2   MAGNESIUM PO Take by mouth.     meloxicam (MOBIC) 15 MG tablet Take 15 mg by mouth as needed.     phentermine 15 MG capsule Take 1 capsule by mouth daily.     VITAMIN D PO Take by mouth.     rizatriptan (MAXALT) 10 MG tablet Take 1 tablet (10 mg total) by mouth as needed for migraine. May repeat in 2 hours if needed 10 tablet 0   diazepam (VALIUM) 10 MG tablet Take 10 mg by mouth at bedtime as needed for sleep. (Patient not taking: Reported on 02/19/2023)     No facility-administered medications prior to visit.    PAST MEDICAL HISTORY: Past Medical History:  Diagnosis Date   Acute pyelonephritis 07/04/2020   Anemia 06/22/2019   Anxiety    Arthritis 2018   Bipolar disorder (HCC)    Chronic kidney disease 2015   Depression    Fibromyalgia    Fibromyalgia 11/17/2020   GERD (gastroesophageal reflux disease)    Headache    History of kidney stones    Hyperlipidemia 01/08/2023   Osteoporosis    Prediabetes 01/08/2023   Shortness of breath 01/08/2023   Sleep apnea     PAST SURGICAL HISTORY: Past Surgical History:  Procedure Laterality Date   ABDOMINAL HYSTERECTOMY  2018   partial   APPENDECTOMY     CHOLECYSTECTOMY     NO PAST SURGERIES     TUBAL LIGATION  1990    FAMILY HISTORY: Family History  Problem Relation Age of Onset   Anxiety disorder Mother    Arthritis Mother    Depression Mother    Kidney disease Mother    Alcohol abuse Father    Asthma Brother    Early death Brother    Heart disease Brother    Diabetes Maternal Aunt    Heart disease Brother    Colon cancer Neg Hx    Colon polyps Neg Hx     SOCIAL HISTORY: Social History   Socioeconomic History   Marital status: Single    Spouse  name: Windy Fast   Number of children: Not on file   Years of education: Not on file   Highest education level: Some college, no degree  Occupational History   Occupation: unemployed  Tobacco Use   Smoking status: Former    Current packs/day: 0.50    Average packs/day: 0.5 packs/day for 25.0 years (12.5 ttl pk-yrs)    Types: Cigarettes    Passive exposure: Never   Smokeless tobacco: Never  Vaping Use   Vaping status: Never Used  Substance and Sexual Activity   Alcohol use: Not Currently    Comment: occasional    Drug use: Never   Sexual activity: Yes    Birth control/protection: Post-menopausal    Comment: estradoil   Other Topics Concern   Not on file  Social History Narrative   Lives alone   left Handed  Drinks 1 cup caffeine daily   Social Determinants of Health   Financial Resource Strain: Patient Declined (01/08/2023)   Overall Financial Resource Strain (CARDIA)    Difficulty of Paying Living Expenses: Patient declined  Food Insecurity: Patient Declined (01/08/2023)   Hunger Vital Sign    Worried About Running Out of Food in the Last Year: Patient declined    Ran Out of Food in the Last Year: Patient declined  Transportation Needs: Patient Declined (01/08/2023)   PRAPARE - Administrator, Civil Service (Medical): Patient declined    Lack of Transportation (Non-Medical): Patient declined  Physical Activity: Sufficiently Active (01/08/2023)   Exercise Vital Sign    Days of Exercise per Week: 4 days    Minutes of Exercise per Session: 150+ min  Stress: Stress Concern Present (01/08/2023)   [employer] of Occupational Health - Occupational Stress Questionnaire    Feeling of Stress : Very much  Social Connections: Moderately Isolated (01/08/2023)   Social Connection and Isolation Panel [NHANES]    Frequency of Communication with Friends and Family: Once a week    Frequency of Social Gatherings with Friends and Family: Once a week    Attends Religious  Services: More than 4 times per year    Active Member of [employer] or Organizations: Yes    Attends Engineer, structural: More than 4 times per year    Marital Status: Divorced  Catering manager Violence: Not on file    PHYSICAL EXAM  GENERAL EXAM/CONSTITUTIONAL: Vitals:  Vitals:   02/19/23 1320  BP: 116/72  Weight: 193 lb (87.5 kg)  Height: 5\' 7"  (1.702 m)   Body mass index is 30.23 kg/m. Wt Readings from Last 3 Encounters:  02/19/23 193 lb (87.5 kg)  01/08/23 199 lb 12.8 oz (90.6 kg)  11/22/22 196 lb 9.6 oz (89.2 kg)   Patient is in no distress; well developed, nourished and groomed; neck is supple  MUSCULOSKELETAL: Gait, strength, tone, movements noted in Neurologic exam below  NEUROLOGIC: MENTAL STATUS:     12/05/2020    1:52 PM  MMSE - Mini Mental State Exam  Orientation to time 5  Orientation to Place 5  Registration 3  Attention/ Calculation 2  Recall 3  Language- name 2 objects 2  Language- repeat 1  Language- follow 3 step command 3  Language- read & follow direction 1  Write a sentence 1  Copy design 1  Total score 27   awake, alert, oriented to person, place and time recent and remote memory intact normal attention and concentration language fluent, comprehension intact, naming intact fund of knowledge appropriate  CRANIAL NERVE:  2nd - no papilledema or hemorrhages on fundoscopic exam 2nd, 3rd, 4th, 6th - pupils equal and reactive to light, visual fields full to confrontation, extraocular muscles intact, no nystagmus 5th - facial sensation symmetric 7th - facial strength symmetric 8th - hearing intact 9th - palate elevates symmetrically, uvula midline 11th - shoulder shrug symmetric 12th - tongue protrusion midline  MOTOR:  normal bulk and tone, full strength in the BUE, BLE  SENSORY:  normal and symmetric to light touch  COORDINATION:  finger-nose-finger, fine finger movements normal  GAIT/STATION:  normal   DIAGNOSTIC DATA  (LABS, IMAGING, TESTING) - I reviewed patient records, labs, notes, testing and imaging myself where available.  Lab Results  Component Value Date   WBC 7.9 01/06/2023   HGB 13.0 01/06/2023   HCT 42 01/06/2023   MCV 96.1 06/28/2021  PLT 287 06/28/2021      Component Value Date/Time   NA 140 01/06/2023 0000   K 4.5 01/06/2023 0000   CL 102 01/06/2023 0000   CO2 25 (A) 01/06/2023 0000   GLUCOSE 98 06/28/2021 1700   BUN 19 01/06/2023 0000   CREATININE 1.1 01/06/2023 0000   CREATININE 1.05 (H) 06/28/2021 1700   CALCIUM 9.5 01/06/2023 0000   PROT 6.4 (L) 07/04/2020 1215   ALBUMIN 4.1 01/06/2023 0000   AST 18 01/06/2023 0000   ALT 19 01/06/2023 0000   ALKPHOS 93 01/06/2023 0000   BILITOT 1.1 07/04/2020 1215   GFRNONAA >60 06/28/2021 1700   GFRAA >60 11/19/2016 2212   Lab Results  Component Value Date   CHOL 247 (A) 01/06/2023   HDL 95 (A) 01/06/2023   LDLCALC 142 01/06/2023   TRIG 62 01/06/2023   Lab Results  Component Value Date   HGBA1C 5.1 01/06/2023   No results found for: "VITAMINB12" Lab Results  Component Value Date   TSH 1.70 01/06/2023    Head CT 02/19/2023 No acute intracranial abnormality.     ASSESSMENT AND PLAN  62 y.o. year old female with   Assessment and Plan    Migraine   We will start amitriptyline 25 mg at night for her recurrent migraines, beginning with half a tablet for the first week before increasing to a full tablet, 50 mg. This decision follows discussions about her intense, stabbing, and pulsating pain in the right temple, accompanied by photophobia and nausea. Previous relief was found with Tylenol and Maxalt, but gabapentin was not tolerated due to hallucinations. Amitriptyline's potential to reduce headache frequency from three days a week to one and its benefits for insomnia were discussed, alongside its side effects and interaction risks. Maxalt will be refilled with 11 refills, and a sample of Nurtec will be provided for acute  migraine treatment.  Insomnia   Amitriptyline 25 mg at night will also address her chronic insomnia, exacerbated by the discontinuation of Valium. The initial dose will be half a tablet for the first week, 25 mg and increasing to a full tablet thereafter, 50 mg. This approach considers previous concerns from PCPs about Valium's long-term risks and discusses amitriptyline's sedative effects.  Hypercholesterolemia   Crestor 20 mg at night will be prescribed to manage her elevated cholesterol levels, with a total cholesterol of 247 mg/dL and LDL of 696 mg/dL. This plan follows a discussion on the importance of cholesterol management for cardiovascular risk reduction and addresses her concerns about the cost and efficacy of previous medications. Phentermine will be discontinued.  General Health Maintenance   She is encouraged to continue regular morning exercise for overall health and hypertension management, avoiding interference with sleep.  Follow-up   A follow-up is scheduled in 6 months, with instructions to send a message via MyChart for any issues or if an earlier appointment is needed.        1. Migraine without aura and without status migrainosus, not intractable     Patient Instructions  Start Amitriptyline 25 mg nightly for one week then increase to 50 mg nightly Start Maxalt as needed for headaches, can take second dose 2 hours after the first dose if headaches not improved Sample of Nurtec given to patient to take as abortive medication Will start patient on Crestor for hyperlipidemia Continue your other medications Continue follow-up PCP Return in 6 months or sooner if worse.    No orders of the defined types were placed  in this encounter.   Meds ordered this encounter  Medications   amitriptyline (ELAVIL) 50 MG tablet    Sig: Take 1 tablet (50 mg total) by mouth at bedtime.    Dispense:  30 tablet    Refill:  0   rizatriptan (MAXALT) 10 MG tablet    Sig: Take 1  tablet (10 mg total) by mouth as needed for migraine. May repeat in 2 hours if needed    Dispense:  10 tablet    Refill:  11   rosuvastatin (CRESTOR) 20 MG tablet    Sig: Take 1 tablet (20 mg total) by mouth daily.    Dispense:  90 tablet    Refill:  3    Return in about 6 months (around 08/19/2023).  I have spent a total of 60 minutes dedicated to this patient today, preparing to see patient, performing a medically appropriate examination and evaluation, ordering tests and/or medications and procedures, and counseling and educating the patient/family/caregiver; independently interpreting result and communicating results to the family/patient/caregiver; and documenting clinical information in the electronic medical record.   Windell Norfolk, MD 02/19/2023, 2:07 PM  [facility] [address] [place], [place] [address] [phone]

## 2023-02-20 ENCOUNTER — Encounter: Payer: Self-pay | Admitting: Neurology

## 2023-02-24 ENCOUNTER — Telehealth: Payer: Self-pay

## 2023-02-24 ENCOUNTER — Encounter: Payer: Self-pay | Admitting: Neurology

## 2023-02-24 NOTE — Telephone Encounter (Signed)
Returned call to patient regarding medication reaction to amitriptyline and mychart message. No answer. Left message to return call.

## 2023-02-26 ENCOUNTER — Other Ambulatory Visit: Payer: Self-pay

## 2023-02-26 ENCOUNTER — Ambulatory Visit: Payer: Medicare Other | Admitting: Cardiovascular Disease

## 2023-02-26 NOTE — Telephone Encounter (Signed)
Pt returned call. Please call back when available. 

## 2023-02-27 ENCOUNTER — Other Ambulatory Visit: Payer: Self-pay | Admitting: Neurology

## 2023-02-27 ENCOUNTER — Ambulatory Visit (HOSPITAL_BASED_OUTPATIENT_CLINIC_OR_DEPARTMENT_OTHER): Payer: Medicare Other

## 2023-02-27 ENCOUNTER — Telehealth: Payer: Medicare Other | Admitting: Nurse Practitioner

## 2023-02-27 DIAGNOSIS — N3000 Acute cystitis without hematuria: Secondary | ICD-10-CM

## 2023-02-27 MED ORDER — CIPROFLOXACIN HCL 500 MG PO TABS: 500.0000 mg | ORAL_TABLET | Freq: Two times a day (BID) | ORAL | 0 refills | Status: AC

## 2023-02-27 MED ORDER — TOPIRAMATE 50 MG PO TABS: 50.0000 mg | ORAL_TABLET | Freq: Every evening | ORAL | 3 refills | Status: DC

## 2023-02-27 MED ORDER — GABAPENTIN 300 MG PO CAPS: 300.0000 mg | ORAL_CAPSULE | Freq: Every day | ORAL | 0 refills | Status: DC

## 2023-02-27 NOTE — Telephone Encounter (Signed)
Call to patient and advised per Dr. Teresa Coombs that she will need to follow up with PCP for medication to help with sleep. She is in agreement to try topamax for her headaches. Advised I would send to Dr. Teresa Coombs to let him know and send a mychart to confirm medication was sent to [facility] in [place]. Patient appreciative of call

## 2023-02-27 NOTE — Telephone Encounter (Signed)
Done. Thanks.

## 2023-02-27 NOTE — Addendum Note (Signed)
Addended byLenn Cal on: 02/27/2023 12:06 PM   Modules accepted: Orders

## 2023-02-27 NOTE — Telephone Encounter (Signed)
Pt returned phone, Pt stated there have been a misunderstanding and need to speak with you to clear that up.  would like a call back

## 2023-02-27 NOTE — Addendum Note (Signed)
Addended byWindell Norfolk on: 02/27/2023 12:45 PM   Modules accepted: Orders

## 2023-02-27 NOTE — Telephone Encounter (Signed)
We will treat the migraines with migraines prophylaxis but cannot give Xanax. She should talk to PCP regarding referral to psychiatry for management of chronic insomnia. We can try Gabapentin at night, for migraine prophylaxis. It can also help with sleep

## 2023-02-27 NOTE — Progress Notes (Signed)
Virtual Visit Consent   Barbara Edwards, you are scheduled for a virtual visit with a [facility] provider today. Just as with appointments in the office, your consent must be obtained to participate. Your consent will be active for this visit and any virtual visit you may have with one of our providers in the next 365 days. If you have a MyChart account, a copy of this consent can be sent to you electronically.  As this is a virtual visit, video technology does not allow for your provider to perform a traditional examination. This may limit your provider's ability to fully assess your condition. If your provider identifies any concerns that need to be evaluated in person or the need to arrange testing (such as labs, EKG, etc.), we will make arrangements to do so. Although advances in technology are sophisticated, we cannot ensure that it will always work on either your end or our end. If the connection with a video visit is poor, the visit may have to be switched to a telephone visit. With either a video or telephone visit, we are not always able to ensure that we have a secure connection.  By engaging in this virtual visit, you consent to the provision of healthcare and authorize for your insurance to be billed (if applicable) for the services provided during this visit. Depending on your insurance coverage, you may receive a charge related to this service.  I need to obtain your verbal consent now. Are you willing to proceed with your visit today? Barbara Edwards has provided verbal consent on 02/27/2023 for a virtual visit (video or telephone). Viviano Simas, FNP  Date: 02/27/2023 5:31 PM  Virtual Visit via Video Note   I, Viviano Simas, connected with  Barbara Edwards  ([id number], December 17, 1960) on 02/27/23 at  5:30 PM EST by a video-enabled telemedicine application and verified that I am speaking with the correct person using two identifiers.  Location: Patient: Virtual Visit Location Patient:  Home Provider: Virtual Visit Location Provider: Home Office   I discussed the limitations of evaluation and management by telemedicine and the availability of in person appointments. The patient expressed understanding and agreed to proceed.    History of Present Illness: Barbara Edwards is a 62 y.o. who identifies as a female who was assigned female at birth, and is being seen today for ongoing UTI symptoms  She was treated for a UTI and a sinus infection at that time and was given Augmentin (02/16/23) still has medicine left from 7 day regimen   Since that time she has been to Neurology and realized she was also having a migraine at that time  Urine culture resulted with e.coli from the UC  Sensitive to all antibiotics   Discomfort with urinating continues without fever   She has a sulfa allergy  Has not responses to Macrobid in the past    Problems:  Patient Active Problem List   Diagnosis Date Noted   Family history of heart failure in brother 01/08/2023   Shortness of breath 01/08/2023   Former smoker 01/08/2023   Prediabetes 01/08/2023   Hyperlipidemia 01/08/2023   Class 1 obesity due to excess calories without serious comorbidity with body mass index (BMI) of 31.0 to 31.9 in adult 01/08/2023   Recurrent UTI (urinary tract infection) 11/22/2022   Leg cramps 11/22/2022   Moderate episode of recurrent major depressive disorder (HCC) 11/22/2022   OSA (obstructive sleep apnea) 11/22/2022   Nonintractable episodic headache 11/22/2022  Low back pain 11/22/2022   Insomnia 11/17/2020   Fibromyalgia 11/17/2020   Connective tissue disease (HCC) 11/17/2020   Anxiety 11/17/2020   Pyelonephritis 07/04/2020   Fever    Osteopenia 08/19/2019   Recurrent urinary tract infection 06/22/2019   Migraine 06/22/2019   Kidney stone 06/22/2019   Irritable bowel syndrome 06/22/2019   Diverticulitis 06/22/2019   Disorder of gallbladder 06/22/2019   Anemia 06/22/2019    Allergies:   Allergies  Allergen Reactions   Sulfa Antibiotics Diarrhea and Nausea Only   Medications:  Current Outpatient Medications:    gabapentin (NEURONTIN) 300 MG capsule, Take 1 capsule (300 mg total) by mouth at bedtime., Disp: 30 capsule, Rfl: 0   albuterol (VENTOLIN HFA) 108 (90 Base) MCG/ACT inhaler, Inhale 2 puffs into the lungs every 6 (six) hours as needed for wheezing or shortness of breath., Disp: 25.5 g, Rfl: 0   amoxicillin-clavulanate (AUGMENTIN) 875-125 MG tablet, Take 1 tablet by mouth every 12 (twelve) hours., Disp: 14 tablet, Rfl: 0   Ascorbic Acid (VITAMIN C PO), Take by mouth., Disp: , Rfl:    b complex vitamins capsule, Take 1 capsule by mouth daily., Disp: , Rfl:    benzonatate (TESSALON) 100 MG capsule, Take 1 capsule (100 mg total) by mouth every 8 (eight) hours., Disp: 21 capsule, Rfl: 0   diazepam (VALIUM) 10 MG tablet, Take 10 mg by mouth at bedtime as needed for sleep. (Patient not taking: Reported on 02/19/2023), Disp: , Rfl:    escitalopram (LEXAPRO) 10 MG tablet, Take 10 mg by mouth daily., Disp: , Rfl:    fluconazole (DIFLUCAN) 150 MG tablet, Take 1 tablet (150 mg total) by mouth every 3 (three) days., Disp: 2 tablet, Rfl: 0   lansoprazole (PREVACID) 30 MG capsule, Take on an empty stomach 30 minutes before breakfast, Disp: 30 capsule, Rfl: 2   MAGNESIUM PO, Take by mouth., Disp: , Rfl:    meloxicam (MOBIC) 15 MG tablet, Take 15 mg by mouth as needed., Disp: , Rfl:    phentermine 15 MG capsule, Take 1 capsule by mouth daily., Disp: , Rfl:    rizatriptan (MAXALT) 10 MG tablet, Take 1 tablet (10 mg total) by mouth as needed for migraine. May repeat in 2 hours if needed, Disp: 10 tablet, Rfl: 11   rosuvastatin (CRESTOR) 20 MG tablet, Take 1 tablet (20 mg total) by mouth daily., Disp: 90 tablet, Rfl: 3   topiramate (TOPAMAX) 50 MG tablet, Take 1 tablet (50 mg total) by mouth at bedtime., Disp: 30 tablet, Rfl: 3   VITAMIN D PO, Take by mouth., Disp: , Rfl:    Observations/Objective: Patient is well-developed, well-nourished in no acute distress.  Resting comfortably  at home.  Head is normocephalic, atraumatic.  No labored breathing.  Speech is clear and coherent with logical content.  Patient is alert and oriented at baseline.    Assessment and Plan:  1. Acute cystitis without hematuria  Push fluids  Discussed black box warning of Cipro with patient    Meds ordered this encounter  Medications   ciprofloxacin (CIPRO) 500 MG tablet    Sig: Take 1 tablet (500 mg total) by mouth 2 (two) times daily for 3 days.    Dispense:  6 tablet    Refill:  0      Follow Up Instructions: I discussed the assessment and treatment plan with the patient. The patient was provided an opportunity to ask questions and all were answered. The patient agreed with the plan  and demonstrated an understanding of the instructions.  A copy of instructions were sent to the patient via MyChart unless otherwise noted below.    The patient was advised to call back or seek an in-person evaluation if the symptoms worsen or if the condition fails to improve as anticipated.    Viviano Simas, FNP

## 2023-02-27 NOTE — Telephone Encounter (Signed)
Returned call to patient, no answer. Left message to return call and also advised I would send a mychart message.

## 2023-02-27 NOTE — Telephone Encounter (Signed)
Mychart message sent with medication and informtation.

## 2023-03-17 ENCOUNTER — Ambulatory Visit: Payer: Medicare Other | Admitting: Physical Therapy

## 2023-03-17 ENCOUNTER — Other Ambulatory Visit: Payer: Self-pay | Admitting: Neurology

## 2023-03-20 ENCOUNTER — Other Ambulatory Visit: Payer: Self-pay | Admitting: Neurology

## 2023-03-20 NOTE — Telephone Encounter (Signed)
Pt calling to check status of request for gabapentin (NEURONTIN) 300 MG capsule sent to [facility] - [place], [place]

## 2023-03-27 ENCOUNTER — Ambulatory Visit: Payer: Medicare Other | Admitting: Physical Therapy

## 2023-04-07 ENCOUNTER — Encounter: Payer: Medicare Other | Admitting: Physical Therapy

## 2023-04-14 ENCOUNTER — Encounter: Payer: Medicare Other | Admitting: Physical Therapy

## 2023-04-14 ENCOUNTER — Ambulatory Visit (HOSPITAL_BASED_OUTPATIENT_CLINIC_OR_DEPARTMENT_OTHER): Payer: Medicare Other | Admitting: Student

## 2023-04-16 ENCOUNTER — Encounter (HOSPITAL_BASED_OUTPATIENT_CLINIC_OR_DEPARTMENT_OTHER): Payer: Self-pay | Admitting: Student

## 2023-04-16 ENCOUNTER — Encounter (HOSPITAL_BASED_OUTPATIENT_CLINIC_OR_DEPARTMENT_OTHER): Payer: Self-pay

## 2023-04-16 ENCOUNTER — Ambulatory Visit (INDEPENDENT_AMBULATORY_CARE_PROVIDER_SITE_OTHER): Payer: Medicare Other | Admitting: Student

## 2023-04-16 VITALS — BP 123/87 | HR 84 | Temp 98.5°F | Ht 66.54 in | Wt 194.5 lb

## 2023-04-16 DIAGNOSIS — Z7689 Persons encountering health services in other specified circumstances: Secondary | ICD-10-CM

## 2023-04-16 DIAGNOSIS — F331 Major depressive disorder, recurrent, moderate: Secondary | ICD-10-CM

## 2023-04-16 DIAGNOSIS — F5105 Insomnia due to other mental disorder: Secondary | ICD-10-CM | POA: Diagnosis not present

## 2023-04-16 DIAGNOSIS — F99 Mental disorder, not otherwise specified: Secondary | ICD-10-CM

## 2023-04-16 DIAGNOSIS — E7800 Pure hypercholesterolemia, unspecified: Secondary | ICD-10-CM

## 2023-04-16 DIAGNOSIS — N951 Menopausal and female climacteric states: Secondary | ICD-10-CM | POA: Diagnosis not present

## 2023-04-16 DIAGNOSIS — M797 Fibromyalgia: Secondary | ICD-10-CM

## 2023-04-16 DIAGNOSIS — R8290 Unspecified abnormal findings in urine: Secondary | ICD-10-CM | POA: Diagnosis not present

## 2023-04-16 DIAGNOSIS — G43009 Migraine without aura, not intractable, without status migrainosus: Secondary | ICD-10-CM

## 2023-04-16 LAB — POCT URINALYSIS DIP (MANUAL ENTRY)
Bilirubin, UA: NEGATIVE
Blood, UA: NEGATIVE
Glucose, UA: NEGATIVE mg/dL
Ketones, POC UA: NEGATIVE mg/dL
Leukocytes, UA: NEGATIVE
Nitrite, UA: NEGATIVE
Protein Ur, POC: NEGATIVE mg/dL
Spec Grav, UA: >=1.03 — AB (ref 1.010–1.025)
Urobilinogen, UA: 0.2 U/dL
pH, UA: 5.5 (ref 5.0–8.0)

## 2023-04-16 MED ORDER — ESZOPICLONE 1 MG PO TABS: 1.0000 mg | ORAL_TABLET | Freq: Every evening | ORAL | 0 refills | Status: DC | PRN

## 2023-04-16 NOTE — Assessment & Plan Note (Signed)
 Stable, well controlled on rizatriptan, continue current regimen.

## 2023-04-16 NOTE — Assessment & Plan Note (Signed)
 Increase dose of lexapro to 15mg . Strongly encourage counseling- given resource.

## 2023-04-16 NOTE — Assessment & Plan Note (Signed)
 Send for culture, if positive will send in abx.

## 2023-04-16 NOTE — Progress Notes (Signed)
 New Patient Office Visit  Subjective    Patient ID: Barbara Edwards, female    DOB: 1960/08/07  Age: 63 y.o. MRN: [id number]  CC:  Chief Complaint  Patient presents with   Establish Care    Here to establish care. On disability for PTSD and having panic attacks. Been having insomnia, if not sleeping good, will get panic attacks. Quality of life has gone down due to being worried about having panic attacks in public. Valium  has helped in the past.    HPI Barbara Edwards presents to establish care. Prior PCP was Dr. Jefferey at [facility]. Last physical was [facility] physician's September 2024.  History of migraines, insomnia, anxiety, depression, PTSD, Fibromyalgia, and hyperlipidemia (started on Crestor  by Neuro without SE).  Fibromyalgia- Went to immunologist- never tested positive for autoimmune disorder. Patient notes trying water aerobics at [facility], notes that it was helping but she has since stopped. Encouraged patient to start back.   Possible Asthma? - Anxiety attacks she gets short of breath. Dr. Sebastian put her on albuterol , believed that she could have asthma. Patient feels that the albuterol  inhaler is helping. Taking 2 times daily, mid day and before bed.   Migraines- Migraines are well controlled. Well controlled on Maxalt . Has not had a migraine in one month. Avoiding triggers such as bright lights. Is very photosensitive. Is on Crestor .   Insomnia- Tapered off valium  in the past. Discussed that I will not be prescribing Valium  to the patient at this time. Does not feel that previous therapies helped including gabapentin , . Patient is taking magnesium, lower dose due to stomach upset. Advised switch to magnesium glycinate as it can be easier on the stomach. Patient has not tried 3mg  glycine. Recently took her friends xanax  2.5 mg. Discussed possible etiologies and recommendations. Discussed possibility that this is related to anxiety. Stressor- Notes that youngest  grandson had heart transplant- almost 2 yo. Patient noted that she was okay with starting trazodone, she later told me that she had tried it after she had already left. Patient was informed that she could try Lunesta  but that this would not be a long term medication. History of sleep apnea, discuss CPAP in the future.      04/16/2023   11:02 AM 11/22/2022    3:33 PM  GAD 7 : Generalized Anxiety Score  Nervous, Anxious, on Edge 3 1  Control/stop worrying 3 2  Worry too much - different things 3 2  Trouble relaxing 3 2  Restless 3 2  Easily annoyed or irritable 3 2  Afraid - awful might happen 0 2  Total GAD 7 Score 18 13  Anxiety Difficulty Somewhat difficult Very difficult    Screenings:  Colon Cancer: Scheduled for consult Jan 13 Lung Cancer: Quit 3 years ago. Was smoking 1 pp week, began in 1997. Indicated.  Breast Cancer: Mammogram normal Cervical Cancer- States was on estradiol in the past - pap came back normal negative per patient in sept 24 Diabetes: Indicated  HLD: 9/30. Was started on Crestor . The 10-year ASCVD risk score (Arnett DK, et al., 2019) is: 2.5%* (Cholesterol units were assumed)  Acute Problems:  UTI- Patient notes that she has a history of recurrent UTIs.  Feels that she may have a UTI today.  UA was negative.  Patient notes foul odor and left-sided associated back pain.  No other symptoms noted.  States that she is normally asymptomatic with UTIs.  Menopausal Symptoms- Hot flashes and night sweats  associated with menopause. Was on estrogen for 3 years but was taken off by her OB/GYN. On premarin cream as needed. Patient states that she has .  Outpatient Encounter Medications as of 04/16/2023  Medication Sig   Ascorbic Acid (VITAMIN C PO) Take by mouth.   b complex vitamins capsule Take 1 capsule by mouth daily.   escitalopram  (LEXAPRO ) 10 MG tablet Take 10 mg by mouth daily.   eszopiclone  (LUNESTA ) 1 MG TABS tablet Take 1 tablet (1 mg total) by mouth at bedtime  as needed for sleep. Take immediately before bedtime   lansoprazole  (PREVACID ) 30 MG capsule Take on an empty stomach 30 minutes before breakfast   MAGNESIUM PO Take by mouth.   rizatriptan  (MAXALT ) 10 MG tablet Take 1 tablet (10 mg total) by mouth as needed for migraine. May repeat in 2 hours if needed   rosuvastatin  (CRESTOR ) 20 MG tablet Take 1 tablet (20 mg total) by mouth daily.   diazepam  (VALIUM ) 10 MG tablet Take 10 mg by mouth at bedtime as needed for sleep. (Patient not taking: Reported on 02/19/2023)   [DISCONTINUED] albuterol  (VENTOLIN  HFA) 108 (90 Base) MCG/ACT inhaler Inhale 2 puffs into the lungs every 6 (six) hours as needed for wheezing or shortness of breath. (Patient not taking: Reported on 04/16/2023)   [DISCONTINUED] amoxicillin -clavulanate (AUGMENTIN ) 875-125 MG tablet Take 1 tablet by mouth every 12 (twelve) hours.   [DISCONTINUED] benzonatate  (TESSALON ) 100 MG capsule Take 1 capsule (100 mg total) by mouth every 8 (eight) hours.   [DISCONTINUED] fluconazole  (DIFLUCAN ) 150 MG tablet Take 1 tablet (150 mg total) by mouth every 3 (three) days. (Patient not taking: Reported on 04/16/2023)   [DISCONTINUED] gabapentin  (NEURONTIN ) 300 MG capsule Take 1 capsule by mouth at bedtime. (Patient not taking: Reported on 04/16/2023)   [DISCONTINUED] meloxicam (MOBIC) 15 MG tablet Take 15 mg by mouth as needed.   [DISCONTINUED] phentermine 15 MG capsule Take 1 capsule by mouth daily.   [DISCONTINUED] topiramate  (TOPAMAX ) 50 MG tablet Take 1 tablet (50 mg total) by mouth at bedtime.   [DISCONTINUED] VITAMIN D  PO Take by mouth.   No facility-administered encounter medications on file as of 04/16/2023.    Past Medical History:  Diagnosis Date   Acute pyelonephritis 07/04/2020   Anemia 06/22/2019   Anxiety    Arthritis 2018   Bipolar disorder (HCC)    Chronic kidney disease 2015   Depression    Fibromyalgia    Fibromyalgia 11/17/2020   GERD (gastroesophageal reflux disease)    Headache     History of kidney stones    Hyperlipidemia 01/08/2023   Insomnia    Migraines    Osteoporosis    Prediabetes 01/08/2023   Shortness of breath 01/08/2023   Sleep apnea     Past Surgical History:  Procedure Laterality Date   ABDOMINAL HYSTERECTOMY  2018   partial, still has ovaries   APPENDECTOMY     CHOLECYSTECTOMY     TUBAL LIGATION  1990    Family History  Problem Relation Age of Onset   Anxiety disorder Mother    Arthritis Mother    Depression Mother    Kidney disease Mother    Alcohol abuse Father    Asthma Brother    Early death Brother    Heart disease Brother        passed away from CHF   Diabetes Maternal Aunt    Colon cancer Neg Hx    Colon polyps Neg Hx  Social History   Socioeconomic History   Marital status: Single    Spouse name: Not on file   Number of children: 2   Years of education: Not on file   Highest education level: Some college, no degree  Occupational History   Occupation: unemployed  Tobacco Use   Smoking status: Former    Types: Cigarettes    Passive exposure: Never   Smokeless tobacco: Never   Tobacco comments:    Quit in 2022  Vaping Use   Vaping status: Never Used  Substance and Sexual Activity   Alcohol use: Never   Drug use: Never   Sexual activity: Yes    Birth control/protection: Post-menopausal    Comment: estradoil   Other Topics Concern   Not on file  Social History Narrative   Not on file   Social Drivers of Health   Financial Resource Strain: Patient Declined (01/08/2023)   Overall Financial Resource Strain (CARDIA)    Difficulty of Paying Living Expenses: Patient declined  Food Insecurity: No Food Insecurity (04/16/2023)   Hunger Vital Sign    Worried About Running Out of Food in the Last Year: Never true    Ran Out of Food in the Last Year: Never true  Transportation Needs: No Transportation Needs (04/16/2023)   PRAPARE - Administrator, Civil Service (Medical): No    Lack of Transportation  (Non-Medical): No  Physical Activity: Sufficiently Active (01/08/2023)   Exercise Vital Sign    Days of Exercise per Week: 4 days    Minutes of Exercise per Session: 150+ min  Stress: Stress Concern Present (01/08/2023)   [employer] of Occupational Health - Occupational Stress Questionnaire    Feeling of Stress : Very much  Social Connections: Moderately Isolated (01/08/2023)   Social Connection and Isolation Panel [NHANES]    Frequency of Communication with Friends and Family: Once a week    Frequency of Social Gatherings with Friends and Family: Once a week    Attends Religious Services: More than 4 times per year    Active Member of [employer] or Organizations: Yes    Attends Engineer, Structural: More than 4 times per year    Marital Status: Divorced  Intimate Partner Violence: Not At Risk (04/16/2023)   Humiliation, Afraid, Rape, and Kick questionnaire    Fear of Current or Ex-Partner: No    Emotionally Abused: No    Physically Abused: No    Sexually Abused: No    ROS  Per HPI      Objective    BP 123/87 (BP Location: Left Arm, Patient Position: Sitting, Cuff Size: Normal)   Pulse 84   Temp 98.5 F (36.9 C) (Oral)   Ht 5' 6.54 (1.69 m)   Wt 194 lb 8 oz (88.2 kg)   SpO2 97%   BMI 30.89 kg/m   Physical Exam Constitutional:      General: She is not in acute distress.    Appearance: Normal appearance. She is not ill-appearing.  HENT:     Head: Normocephalic and atraumatic.     Right Ear: External ear normal.     Left Ear: External ear normal.     Nose: Nose normal. No congestion or rhinorrhea.     Mouth/Throat:     Mouth: Mucous membranes are moist.     Pharynx: Oropharynx is clear. No oropharyngeal exudate or posterior oropharyngeal erythema.  Eyes:     General: No scleral icterus.    Extraocular Movements:  Extraocular movements intact.     Conjunctiva/sclera: Conjunctivae normal.     Pupils: Pupils are equal, round, and reactive to light.  Neck:      Vascular: No carotid bruit.  Cardiovascular:     Rate and Rhythm: Normal rate and regular rhythm.     Pulses: Normal pulses.     Heart sounds: Normal heart sounds. No murmur heard.    No friction rub.  Pulmonary:     Effort: Pulmonary effort is normal. No respiratory distress.     Breath sounds: Normal breath sounds. No wheezing (none present), rhonchi or rales.  Abdominal:     Tenderness: There is no abdominal tenderness. There is left CVA tenderness (slight). There is no right CVA tenderness or guarding.  Musculoskeletal:        General: Normal range of motion.     Cervical back: Neck supple.     Right lower leg: No edema.     Left lower leg: No edema.  Lymphadenopathy:     Cervical: No cervical adenopathy.  Skin:    General: Skin is warm and dry.  Neurological:     Mental Status: She is alert and oriented to person, place, and time.         Assessment & Plan:   Encounter to establish care  Moderate episode of recurrent major depressive disorder (HCC) Assessment & Plan: Increase dose of lexapro  to 15mg . Strongly encourage counseling- given resource.   Abnormal urine odor Assessment & Plan: Send for culture, if positive will send in abx.  Orders: -     POCT urinalysis dipstick -     Urine Culture  Insomnia due to other mental disorder Assessment & Plan: Given CBT-I resources. Order short term Lunesta  1mg  tablet nightly. Discussed possibility of complex sleep behaviors. Will not refill valium  at this time. Discuss OSA at next visit.   Orders: -     Eszopiclone ; Take 1 tablet (1 mg total) by mouth at bedtime as needed for sleep. Take immediately before bedtime  Dispense: 30 tablet; Refill: 0  Hot flashes due to menopause Assessment & Plan: Continue to follow with OBGYN. Will not prescribe estrogen therapy at this time. No s/s of malignancy noted at this time.   Pure hypercholesterolemia Assessment & Plan: Placed on Crestor  by neurology, stable,  may continue therapy.   Fibromyalgia Assessment & Plan: Encourage water aerobics. Continue to follow with neurology.   Migraine without aura and without status migrainosus, not intractable Assessment & Plan: Stable, well controlled on rizatriptan , continue current regimen.    I have spent greater than 60 minutes charting, educating, diagnosing and managing this patient for this visit.  Return in about 4 weeks (around 05/14/2023).   Cephus Tupy T Caran Storck, PA-C

## 2023-04-16 NOTE — Assessment & Plan Note (Signed)
 Placed on Crestor by neurology, stable, may continue therapy.

## 2023-04-16 NOTE — Assessment & Plan Note (Addendum)
 Given CBT-I resources. Order short term Lunesta 1mg  tablet nightly. Discussed possibility of complex sleep behaviors. Will not refill valium at this time. Discuss OSA at next visit.

## 2023-04-16 NOTE — Patient Instructions (Signed)
 It was nice to see you today!  As we discussed in clinic I will get a schedule for you to taper off of the lexapro  and start trazodone for sleep.  Supplements for sleep Magnesium glycinate- 400mg .  10mg  of melatonin. 3mg  of glycine.   Counseling [facility] [address]  [place], [place] [address]  Phone: [phone]  Fax: [phone]   A type of therapy known as CBT-I is considered the first-line treatment for insomnia. It may even be able to help 4/5 individuals get better sleep. At this time I am unaware of any institutions offer CBT-I within our area.  There are some online therapy use which can be both effective for sleep and cost effective as well.  Go! To Sleep- Web-based App-this is a energy manager therapy platform created by [facility] clinic for patients with insomnia.  Last time checked, the cost was around $40 (pretty cheap for better sleep!).  CBT-I Coach- iOS/Android App- This app provides education about sleep and CBT-I, personalized feedback about your sleep, and an option for sleep diary to track wake and sleep times.  Sleepio-  iOS/Android App and Web-based App- A fairly popular option, may be worth a try!  [facility]- (text based)- this option doesn't provide with audio or with an app but cory has some helpful information for you!   If you have any problems before your next visit feel free to message me via MyChart (minor issues or questions) or call the office, otherwise you may reach out to schedule an office visit.  Thank you! Akeila Lana, PA-C

## 2023-04-16 NOTE — Assessment & Plan Note (Addendum)
 Continue to follow with OBGYN. Will not prescribe estrogen therapy at this time. No s/s of malignancy noted at this time.

## 2023-04-16 NOTE — Assessment & Plan Note (Signed)
 Encourage water aerobics. Continue to follow with neurology.

## 2023-04-18 ENCOUNTER — Encounter (HOSPITAL_BASED_OUTPATIENT_CLINIC_OR_DEPARTMENT_OTHER): Payer: Self-pay | Admitting: Student

## 2023-04-18 LAB — URINE CULTURE

## 2023-04-21 ENCOUNTER — Ambulatory Visit: Payer: Medicare Other | Admitting: Physician Assistant

## 2023-04-21 NOTE — Telephone Encounter (Signed)
 Called patient and discussed that antibiotics are not indicated for mixed urogenital flora, she does not meet criteria for UTI. Discussed current symptoms- these seem to be more related to incontinence rather than UTI. Denied dysuria. No abx indicated, will continue to follow. Patient notes additionally that her sleep has improved since starting medication.

## 2023-04-21 NOTE — Progress Notes (Deleted)
 04/21/2023 Barbara Edwards [id number] October 15, 1960  Referring provider: Sebastian Beverley NOVAK, MD Primary GI doctor: Dr. Shila  ASSESSMENT AND PLAN:   Assessment and Plan              Patient Care Team: Rothfuss, Jacob T, PA-C as PCP - General (Physician Assistant)  HISTORY OF PRESENT ILLNESS: 63 y.o. female with a past medical history of colon polyps, diverticulosis, recurrent UTIs, pyelonephritis, anxiety depression and others listed below presents today to discuss repeat colonoscopy.  Last seen in the office 06/2021 by Vina Dasen, NP for fecal leakage thought to be secondary to poor sphincter tone referred to pelvic floor PT in [place]. January 2023 patient had colonoscopy with poor prep and syncopal episode with 2-day bowel prep  Cologuard not appropriate with patient's history. Patient is here to discuss prep.   Discussed the use of AI scribe software for clinical note transcription with the patient, who gave verbal consent to proceed.  History of Present Illness            She  reports that she has quit smoking. Her smoking use included cigarettes. She has never been exposed to tobacco smoke. She has never used smokeless tobacco. She reports that she does not drink alcohol and does not use drugs.  Previous GI Evaluations    Endoscopies:    Colonoscopy 06/27/2014 at [facility] by Dr. Charlanne -1 cm flat sessile polyp removed from the proximal descending colon and 3 polyps were removed from the proximal sigmoid colon. Mild sigmoid diverticulosis was noted. Biopsies were consistent with tubular adenomatous and hyperplastic polyps without dysplasia.   January 2023 colonoscopy and EGD Colonoscopy  --Stool in the entire examined colon. - The examination was otherwise normal on direct and retroflexion views. - No specimens collected. EGD --LA Grade C erosive esophagitis with no bleeding. Biopsied. - 3 cm hiatal hernia. - Normal stomach. - Normal  examined duodenum Diagnosis Surgical [P], random sites esophagus REACTIVE SQUAMOUS MUCOSA MINIMAL CHRONIC GASTRITIS WITH FOVEOLAR HYPERPLASIA NEGATIVE FOR INTESTINAL METAPLASIA, DYSPLASIA AND CARCINOMA   Imaging:  3/39/22 Non-contrast CTAP for R flank pain  IMPRESSION: 1. Mild hydronephrosis on the right without evident renal or ureteral calculus. Question recent calculus passage on the right. Pyelonephritis could present in this manner and is a differential consideration. No appreciable renal abscess on noncontrast enhanced study.  2. Sigmoid diverticula without diverticulitis. No bowel wall thickening or bowel obstruction. No abscess in the abdomen or pelvis. No periappendiceal region inflammatory change.  3.  Gallbladder absent.  Uterus absent.      RELEVANT LABS AND IMAGING:  Results          CBC    Component Value Date/Time   WBC 7.9 01/06/2023 0000   WBC 6.4 06/28/2021 1700   RBC 4.20 01/06/2023 0000   HGB 13.0 01/06/2023 0000   HCT 42 01/06/2023 0000   PLT 287 06/28/2021 1700   MCV 96.1 06/28/2021 1700   MCH 31.2 06/28/2021 1700   MCHC 32.5 06/28/2021 1700   RDW 12.8 06/28/2021 1700   LYMPHSABS 2.0 06/28/2021 1700   MONOABS 0.5 06/28/2021 1700   EOSABS 0.1 06/28/2021 1700   BASOSABS 0.0 06/28/2021 1700   Recent Labs    01/06/23 0000  HGB 13.0    CMP     Component Value Date/Time   NA 140 01/06/2023 0000   K 4.5 01/06/2023 0000   CL 102 01/06/2023 0000   CO2 25 (A) 01/06/2023 0000   GLUCOSE  98 06/28/2021 1700   BUN 19 01/06/2023 0000   CREATININE 1.1 01/06/2023 0000   CREATININE 1.05 (H) 06/28/2021 1700   CALCIUM  9.5 01/06/2023 0000   PROT 6.4 (L) 07/04/2020 1215   ALBUMIN 4.1 01/06/2023 0000   AST 18 01/06/2023 0000   ALT 19 01/06/2023 0000   ALKPHOS 93 01/06/2023 0000   BILITOT 1.1 07/04/2020 1215   GFRNONAA >60 06/28/2021 1700   GFRAA >60 11/19/2016 2212      Latest Ref Rng & Units 01/06/2023   12:00 AM 07/04/2020   12:15 PM  Hepatic  Function  Total Protein 6.5 - 8.1 g/dL  6.4   Albumin 3.5 - 5.0 4.1     3.3   AST 13 - 35 18     18   ALT 7 - 35 U/L 19     15   Alk Phosphatase 25 - 125 93     64   Total Bilirubin 0.3 - 1.2 mg/dL  1.1   Bilirubin, Direct 0.0 - 0.2 mg/dL  0.2      This result is from an external source.      Current Medications:    Current Outpatient Medications (Cardiovascular):    rosuvastatin  (CRESTOR ) 20 MG tablet, Take 1 tablet (20 mg total) by mouth daily.   Current Outpatient Medications (Analgesics):    rizatriptan  (MAXALT ) 10 MG tablet, Take 1 tablet (10 mg total) by mouth as needed for migraine. May repeat in 2 hours if needed   Current Outpatient Medications (Other):    Ascorbic Acid (VITAMIN C PO), Take by mouth.   b complex vitamins capsule, Take 1 capsule by mouth daily.   diazepam  (VALIUM ) 10 MG tablet, Take 10 mg by mouth at bedtime as needed for sleep. (Patient not taking: Reported on 02/19/2023)   escitalopram  (LEXAPRO ) 10 MG tablet, Take 10 mg by mouth daily.   eszopiclone  (LUNESTA ) 1 MG TABS tablet, Take 1 tablet (1 mg total) by mouth at bedtime as needed for sleep. Take immediately before bedtime   lansoprazole  (PREVACID ) 30 MG capsule, Take on an empty stomach 30 minutes before breakfast   MAGNESIUM PO, Take by mouth.  Medical History:  Past Medical History:  Diagnosis Date   Acute pyelonephritis 07/04/2020   Anemia 06/22/2019   Anxiety    Arthritis 2018   Bipolar disorder (HCC)    Chronic kidney disease 2015   Depression    Fibromyalgia    Fibromyalgia 11/17/2020   GERD (gastroesophageal reflux disease)    Headache    History of kidney stones    Hyperlipidemia 01/08/2023   Insomnia    Migraines    Osteoporosis    Prediabetes 01/08/2023   Shortness of breath 01/08/2023   Sleep apnea    Allergies:  Allergies  Allergen Reactions   Sulfa Antibiotics Diarrhea and Nausea Only     Surgical History:  She  has a past surgical history that includes  Abdominal hysterectomy (2018); Cholecystectomy; Appendectomy; and Tubal ligation (1990). Family History:  Her family history includes Alcohol abuse in her father; Anxiety disorder in her mother; Arthritis in her mother; Asthma in her brother; Depression in her mother; Diabetes in her maternal aunt; Early death in her brother; Heart disease in her brother; Kidney disease in her mother.  REVIEW OF SYSTEMS  : All other systems reviewed and negative except where noted in the History of Present Illness.  PHYSICAL EXAM: There were no vitals taken for this visit. General Appearance: Well nourished, in no  apparent distress. Head:   Normocephalic and atraumatic. Eyes:  sclerae anicteric,conjunctive pink  Respiratory: Respiratory effort normal, BS equal bilaterally without rales, rhonchi, wheezing. Cardio: RRR with no MRGs. Peripheral pulses intact.  Abdomen: Soft,  {BlankSingle:19197::Flat,Obese,Non-distended} ,active bowel sounds. {actendernessAB:27319} tenderness {anatomy; site abdomen:5010}. {BlankMultiple:19196::Without guarding,With guarding,Without rebound,With rebound}. No masses. Rectal: {acrectalexam:27461} Musculoskeletal: Full ROM, {PSY - GAIT AND STATION:22860} gait. {With/Without:304960234} edema. Skin:  Dry and intact without significant lesions or rashes Neuro: Alert and  oriented x4;  No focal deficits. Psych:  Cooperative. Normal mood and affect.    Alan JONELLE Coombs, PA-C 8:35 AM

## 2023-04-25 ENCOUNTER — Ambulatory Visit: Payer: Medicare Other | Admitting: Physician Assistant

## 2023-04-25 ENCOUNTER — Encounter: Payer: Self-pay | Admitting: Physician Assistant

## 2023-04-25 VITALS — BP 110/70 | HR 85 | Ht 67.0 in | Wt 196.2 lb

## 2023-04-25 DIAGNOSIS — K5909 Other constipation: Secondary | ICD-10-CM | POA: Diagnosis not present

## 2023-04-25 DIAGNOSIS — Z860100 Personal history of colon polyps, unspecified: Secondary | ICD-10-CM

## 2023-04-25 DIAGNOSIS — R197 Diarrhea, unspecified: Secondary | ICD-10-CM | POA: Diagnosis not present

## 2023-04-25 DIAGNOSIS — K5900 Constipation, unspecified: Secondary | ICD-10-CM

## 2023-04-25 DIAGNOSIS — Z860101 Personal history of adenomatous and serrated colon polyps: Secondary | ICD-10-CM

## 2023-04-25 MED ORDER — NA SULFATE-K SULFATE-MG SULF 17.5-3.13-1.6 GM/177ML PO SOLN: 1.0000 | Freq: Once | ORAL | 0 refills | Status: AC

## 2023-04-25 NOTE — Patient Instructions (Signed)
Please purchase the following medications over the counter and take as directed: Miralax 17 grams twice daily x 1 week leading up to colonoscopy.   You have been scheduled for a colonoscopy. Please follow written instructions given to you at your visit today.   Please pick up your prep supplies at the pharmacy within the next 1-3 days.  If you use inhalers (even only as needed), please bring them with you on the day of your procedure.  DO NOT TAKE 7 DAYS PRIOR TO TEST- Trulicity (dulaglutide) Ozempic, Wegovy (semaglutide) Mounjaro (tirzepatide) Bydureon Bcise (exanatide extended release)  DO NOT TAKE 1 DAY PRIOR TO YOUR TEST Rybelsus (semaglutide) Adlyxin (lixisenatide) Victoza (liraglutide) Byetta (exanatide) ___________________________________________________________________________  _______________________________________________________  If your blood pressure at your visit was 140/90 or greater, please contact your primary care physician to follow up on this.  _______________________________________________________  If you are age 3 or older, your body mass index should be between 23-30. Your Body mass index is 30.74 kg/m. If this is out of the aforementioned range listed, please consider follow up with your Primary Care Provider.  If you are age 73 or younger, your body mass index should be between 19-25. Your Body mass index is 30.74 kg/m. If this is out of the aformentioned range listed, please consider follow up with your Primary Care Provider.   ________________________________________________________  The [facility] providers would like to encourage you to use [facility] to communicate with providers for non-urgent requests or questions.  Due to long hold times on the telephone, sending your provider a message by [facility] may be a faster and more efficient way to get a response.  Please allow 48 business hours for a response.  Please remember that this is for non-urgent  requests.  _______________________________________________________

## 2023-04-25 NOTE — Progress Notes (Signed)
Chief Complaint: Discuss colonoscopy.  HPI:    Barbara Edwards is a 63 year old female with a past medical history as listed below including anxiety, bipolar disorder, fibromyalgia, GERD, osteoporosis, prior cholecystectomy and multiple others, known to Dr. Lavon Paganini, who was referred to me by Maren Reamer Lesle Chris, FNP for consideration of a colonoscopy.    12/25/2020 colonoscopy for colon cancer screening by Dr. Chales Abrahams with 1 cm flat sessile polyp in the proximal descending colon and 3 more polyps in the proximal sigmoid colon within 35 cm from the anal verge.  Few small diverticula in the sigmoid colon.    04/13/2021 colonoscopy and EGD.  Colonoscopy done for history of colon polyps and adenoma 10 mm or greater in size with stool throughout the entire colon.  No findings.  Recommended an extended bowel prep.  Recommended repeat.  EGD with LA grade C erosive esophagitis and a 3 cm hiatal hernia.  Recommended Lansoprazole 30 mg daily.  Pathology showed minimal chronic gastritis.    07/05/2021 office visit with Barbara Edwards.  That time discussed she had poor prep and a colonoscopy in January 2023 and had a syncopal episode with a 2-day bowel prep so should not 1 another unless it was in the hospital.  She wanted a Cologuard.     Today, patient presents to clinic to discuss a colonoscopy.  Tells me that she knows she was told that she did not have good prep at time of last colonoscopy though the pictures looked good to her.  Does describe that she has chronic issues with constipation and then diarrhea which seem to radiate back-and-forth.  She did try doing a 2-day bowel prep but could not make it through and was left laying on the bathroom floor basically incapacitated due to dehydration.  Lives by herself and just cannot do this again.  Denies any real change in bowel habits, no new abdominal pain.    Denies fever, chills or weight loss.  Past Medical History:  Diagnosis Date   Acute pyelonephritis 07/04/2020    Anemia 06/22/2019   Anxiety    Arthritis 2018   Bipolar disorder (HCC)    Chronic kidney disease 2015   Depression    Fibromyalgia    Fibromyalgia 11/17/2020   GERD (gastroesophageal reflux disease)    Headache    History of kidney stones    Hyperlipidemia 01/08/2023   Insomnia    Migraines    Osteoporosis    Prediabetes 01/08/2023   Shortness of breath 01/08/2023   Sleep apnea     Past Surgical History:  Procedure Laterality Date   ABDOMINAL HYSTERECTOMY  2018   partial, still has ovaries   APPENDECTOMY     CHOLECYSTECTOMY     TUBAL LIGATION  1990    Current Outpatient Medications  Medication Sig Dispense Refill   Ascorbic Acid (VITAMIN C PO) Take by mouth.     b complex vitamins capsule Take 1 capsule by mouth daily.     diazepam (VALIUM) 10 MG tablet Take 10 mg by mouth at bedtime as needed for sleep. (Patient not taking: Reported on 02/19/2023)     escitalopram (LEXAPRO) 10 MG tablet Take 10 mg by mouth daily.     eszopiclone (LUNESTA) 1 MG TABS tablet Take 1 tablet (1 mg total) by mouth at bedtime as needed for sleep. Take immediately before bedtime 30 tablet 0   lansoprazole (PREVACID) 30 MG capsule Take on an empty stomach 30 minutes before breakfast 30 capsule 2  MAGNESIUM PO Take by mouth.     rizatriptan (MAXALT) 10 MG tablet Take 1 tablet (10 mg total) by mouth as needed for migraine. May repeat in 2 hours if needed 10 tablet 11   rosuvastatin (CRESTOR) 20 MG tablet Take 1 tablet (20 mg total) by mouth daily. 90 tablet 3   No current facility-administered medications for this visit.    Allergies as of 04/25/2023 - Review Complete 04/16/2023  Allergen Reaction Noted   Sulfa antibiotics Diarrhea and Nausea Only 12/05/2020    Family History  Problem Relation Age of Onset   Anxiety disorder Mother    Arthritis Mother    Depression Mother    Kidney disease Mother    Alcohol abuse Father    Asthma Brother    Early death Brother    Heart disease  Brother        passed away from CHF   Diabetes Maternal Aunt    Colon cancer Neg Hx    Colon polyps Neg Hx     Social History   Socioeconomic History   Marital status: Single    Spouse name: Not on file   Number of children: 2   Years of education: Not on file   Highest education level: Some college, no degree  Occupational History   Occupation: unemployed  Tobacco Use   Smoking status: Former    Types: Cigarettes    Passive exposure: Never   Smokeless tobacco: Never   Tobacco comments:    Quit in 2022  Vaping Use   Vaping status: Never Used  Substance and Sexual Activity   Alcohol use: Never   Drug use: Never   Sexual activity: Yes    Birth control/protection: Post-menopausal    Comment: estradoil   Other Topics Concern   Not on file  Social History Narrative   Not on file   Social Drivers of Health   Financial Resource Strain: Patient Declined (01/08/2023)   Overall Financial Resource Strain (CARDIA)    Difficulty of Paying Living Expenses: Patient declined  Food Insecurity: No Food Insecurity (04/16/2023)   Hunger Vital Sign    Worried About Running Out of Food in the Last Year: Never true    Ran Out of Food in the Last Year: Never true  Transportation Needs: No Transportation Needs (04/16/2023)   PRAPARE - Administrator, Civil Service (Medical): No    Lack of Transportation (Non-Medical): No  Physical Activity: Sufficiently Active (01/08/2023)   Exercise Vital Sign    Days of Exercise per Week: 4 days    Minutes of Exercise per Session: 150+ min  Stress: Stress Concern Present (01/08/2023)   [employer] of Occupational Health - Occupational Stress Questionnaire    Feeling of Stress : Very much  Social Connections: Moderately Isolated (01/08/2023)   Social Connection and Isolation Panel [NHANES]    Frequency of Communication with Friends and Family: Once a week    Frequency of Social Gatherings with Friends and Family: Once a week    Attends  Religious Services: More than 4 times per year    Active Member of [employer] or Organizations: Yes    Attends Banker Meetings: More than 4 times per year    Marital Status: Divorced  Intimate Partner Violence: Not At Risk (04/16/2023)   Humiliation, Afraid, Rape, and Kick questionnaire    Fear of Current or Ex-Partner: No    Emotionally Abused: No    Physically Abused: No  Sexually Abused: No    Review of Systems:    Constitutional: No weight loss, fever or chills Skin: No rash Cardiovascular: No chest pain Respiratory: No SOB Gastrointestinal: See HPI and otherwise negative Genitourinary: No dysuria  Neurological: No headache, dizziness or syncope Musculoskeletal: No new muscle or joint pain Hematologic: No bleeding  Psychiatric: +anxiety   Physical Exam:  Vital signs: BP 110/70   Pulse 85   Ht 5\' 7"  (1.702 m)   Wt 196 lb 4 oz (89 kg)   SpO2 96%   BMI 30.74 kg/m    Constitutional:   Pleasant overweight Caucasian female appears to be in NAD, Well developed, Well nourished, alert and cooperative Respiratory: Respirations even and unlabored. Lungs clear to auscultation bilaterally.   No wheezes, crackles, or rhonchi.  Cardiovascular: Normal S1, S2. No MRG. Regular rate and rhythm. No peripheral edema, cyanosis or pallor.  Gastrointestinal:  Soft, nondistended, nontender. No rebound or guarding. Normal bowel sounds. No appreciable masses or hepatomegaly. Rectal:  Not performed.  Psychiatric:  Demonstrates good judgement and reason without abnormal affect or behaviors.  RELEVANT LABS AND IMAGING: CBC    Component Value Date/Time   WBC 7.9 01/06/2023 0000   WBC 6.4 06/28/2021 1700   RBC 4.20 01/06/2023 0000   HGB 13.0 01/06/2023 0000   HCT 42 01/06/2023 0000   PLT 287 06/28/2021 1700   MCV 96.1 06/28/2021 1700   MCH 31.2 06/28/2021 1700   MCHC 32.5 06/28/2021 1700   RDW 12.8 06/28/2021 1700   LYMPHSABS 2.0 06/28/2021 1700   MONOABS 0.5 06/28/2021 1700    EOSABS 0.1 06/28/2021 1700   BASOSABS 0.0 06/28/2021 1700    CMP     Component Value Date/Time   NA 140 01/06/2023 0000   K 4.5 01/06/2023 0000   CL 102 01/06/2023 0000   CO2 25 (A) 01/06/2023 0000   GLUCOSE 98 06/28/2021 1700   BUN 19 01/06/2023 0000   CREATININE 1.1 01/06/2023 0000   CREATININE 1.05 (H) 06/28/2021 1700   CALCIUM 9.5 01/06/2023 0000   PROT 6.4 (L) 07/04/2020 1215   ALBUMIN 4.1 01/06/2023 0000   AST 18 01/06/2023 0000   ALT 19 01/06/2023 0000   ALKPHOS 93 01/06/2023 0000   BILITOT 1.1 07/04/2020 1215   GFRNONAA >60 06/28/2021 1700   GFRAA >60 11/19/2016 2212    Assessment: 1.  History of adenomatous polyps: Last colonoscopy in January 2023 with bad prep, repeat recommended in a year, patient is overdue 2.  Chronic constipation/mix of diarrhea: Patient tells me she will have days of constipation and then diarrhea, nothing is consistent, seems linked to her anxiety and emotion/stress level; likely IBS  Plan: 1.  Scheduled patient for a surveillance colonoscopy in the [facility] with Dr. Lavon Paganini.  Did provide the patient a detailed list of risks for the procedure and she agrees to proceed. 2.  Patient will have extended prep for this procedure.  She could not tolerate a strict 2-day bowel prep so instead we will start MiraLAX twice daily a week before time of procedure.  She will then proceed with prescription prep as normal. 3.  Patient to follow in clinic per recommendations after time of procedure.  Hyacinth Meeker, PA-C [facility] 04/25/2023, 2:03 PM  Cc: Randa Spike, FNP

## 2023-04-28 ENCOUNTER — Telehealth: Payer: Self-pay | Admitting: Physician Assistant

## 2023-04-28 NOTE — Telephone Encounter (Signed)
PT rescheduled procedure and needs updated instructions. Please advise.

## 2023-05-06 ENCOUNTER — Telehealth: Payer: Self-pay | Admitting: Physician Assistant

## 2023-05-06 NOTE — Telephone Encounter (Signed)
Patient called stating her insurance is not covering for the Suprep medication, patient is wondering if she can get alternative that's not generic.

## 2023-05-07 NOTE — Telephone Encounter (Signed)
Patient states her insurance company told her that they will not cover preps that have a generic. Informed patient that it is normally the opposite. Patient states she will call her insurance company and find out what preps are covered. Informed patient that I have a free sample of Plenvu prep if she would just rather come by our office and get the prep with new instructions. Patient agreed and states it may not be until next week she could come by our office. Told her that it was fine. Come by at her convenience.

## 2023-05-07 NOTE — Telephone Encounter (Signed)
Barbara Edwards I am helping in [place]'s box.  Looks like this pt was set up while in the office.

## 2023-05-08 NOTE — Telephone Encounter (Signed)
Patient requesting to speak with a nurse in regards to her POC. Please advise.   Thank you

## 2023-05-08 NOTE — Telephone Encounter (Signed)
Left message on machine to call back

## 2023-05-09 ENCOUNTER — Encounter: Payer: Medicare Other | Admitting: Gastroenterology

## 2023-05-09 NOTE — Telephone Encounter (Signed)
 Left message on machine to call back

## 2023-05-09 NOTE — Telephone Encounter (Signed)
The pt states that she has already spoken to someone and nothing further is needed.

## 2023-05-12 ENCOUNTER — Telehealth: Payer: Self-pay | Admitting: Physician Assistant

## 2023-05-12 NOTE — Telephone Encounter (Signed)
Patient called requesting to speak with a nurse having further questions on Miralax and is stating she is having abdominal pain

## 2023-05-13 NOTE — Telephone Encounter (Signed)
 Patient returned call

## 2023-05-13 NOTE — Telephone Encounter (Signed)
Called patient twice. Left message on vm for patient to return call, also informed that I will send her a MyChart message since we keep missing each others phone calls.

## 2023-05-13 NOTE — Telephone Encounter (Signed)
 Pt returned call. Pt states that her abdominal pain started after starting Miralax . Pt only takes 1 capful of Miralax  daily, only having 1 BM every morning. Pt describes stool as muddy, she reports abdominal bloating, pain, and leakage of stool. Pt reports that the abdominal pain starts a couple of hours into the day. Pt does not feel like she is evacuating completley. I told pt that the abdominal bloating, pain, and leaking stool is likely overflow diarrhea related to constipation. I advised pt that she could increase Miralax  up to TID or complete a bowel purge. Pt requested instructions for Miralax  bowel purge be sent to MyChart. Pt was also advised to continue Miralax  daily after purge. Pt verbalized understanding and had no concerns at the end of the call.

## 2023-05-13 NOTE — Telephone Encounter (Signed)
 Lm on vm for patient to return call

## 2023-05-14 ENCOUNTER — Ambulatory Visit (HOSPITAL_BASED_OUTPATIENT_CLINIC_OR_DEPARTMENT_OTHER): Payer: Medicare Other | Admitting: Student

## 2023-05-15 ENCOUNTER — Ambulatory Visit (HOSPITAL_BASED_OUTPATIENT_CLINIC_OR_DEPARTMENT_OTHER): Payer: Medicare Other

## 2023-05-19 ENCOUNTER — Telehealth (HOSPITAL_BASED_OUTPATIENT_CLINIC_OR_DEPARTMENT_OTHER): Payer: Self-pay

## 2023-05-19 ENCOUNTER — Ambulatory Visit (HOSPITAL_BASED_OUTPATIENT_CLINIC_OR_DEPARTMENT_OTHER): Payer: Medicare Other | Admitting: Student

## 2023-05-19 ENCOUNTER — Encounter (HOSPITAL_BASED_OUTPATIENT_CLINIC_OR_DEPARTMENT_OTHER): Payer: Self-pay | Admitting: Student

## 2023-05-19 VITALS — BP 115/80 | HR 78 | Temp 98.0°F | Ht 67.0 in | Wt 198.3 lb

## 2023-05-19 DIAGNOSIS — E6609 Other obesity due to excess calories: Secondary | ICD-10-CM

## 2023-05-19 DIAGNOSIS — S39012A Strain of muscle, fascia and tendon of lower back, initial encounter: Secondary | ICD-10-CM

## 2023-05-19 DIAGNOSIS — F99 Mental disorder, not otherwise specified: Secondary | ICD-10-CM | POA: Diagnosis not present

## 2023-05-19 DIAGNOSIS — N390 Urinary tract infection, site not specified: Secondary | ICD-10-CM | POA: Diagnosis not present

## 2023-05-19 DIAGNOSIS — Z860100 Personal history of colon polyps, unspecified: Secondary | ICD-10-CM | POA: Insufficient documentation

## 2023-05-19 DIAGNOSIS — R634 Abnormal weight loss: Secondary | ICD-10-CM

## 2023-05-19 DIAGNOSIS — E66811 Obesity, class 1: Secondary | ICD-10-CM

## 2023-05-19 DIAGNOSIS — F5105 Insomnia due to other mental disorder: Secondary | ICD-10-CM

## 2023-05-19 DIAGNOSIS — R159 Full incontinence of feces: Secondary | ICD-10-CM | POA: Insufficient documentation

## 2023-05-19 DIAGNOSIS — Z6831 Body mass index (BMI) 31.0-31.9, adult: Secondary | ICD-10-CM

## 2023-05-19 LAB — POCT URINALYSIS DIPSTICK
Bilirubin, UA: NEGATIVE
Blood, UA: NEGATIVE
Glucose, UA: NEGATIVE
Ketones, UA: NEGATIVE
Leukocytes, UA: NEGATIVE
Nitrite, UA: NEGATIVE
Protein, UA: POSITIVE — AB
Spec Grav, UA: 1.025 (ref 1.010–1.025)
Urobilinogen, UA: 0.2 U/dL
pH, UA: 6.5 (ref 5.0–8.0)

## 2023-05-19 MED ORDER — TOPIRAMATE 25 MG PO TABS: 25.0000 mg | ORAL_TABLET | Freq: Two times a day (BID) | ORAL | 3 refills | Status: DC

## 2023-05-19 MED ORDER — ESZOPICLONE 2 MG PO TABS: 2.0000 mg | ORAL_TABLET | Freq: Every evening | ORAL | 0 refills | Status: AC | PRN

## 2023-05-19 MED ORDER — CYCLOBENZAPRINE HCL 5 MG PO TABS: 5.0000 mg | ORAL_TABLET | Freq: Three times a day (TID) | ORAL | 1 refills | Status: AC | PRN

## 2023-05-19 NOTE — Progress Notes (Signed)
 Established Patient Office Visit  Subjective   Patient ID: Barbara Edwards, female    DOB: Apr 04, 1961  Age: 63 y.o. MRN: [id number]  Chief Complaint  Patient presents with   Follow-up    Follow-up visit. Pt. Stats of lower back pain.  Pt. Stats that she might have a UTI for the lower back pains.    HPI  Weight loss- Has tried dieting for less than 6 months. Has tried walking about 15 minutes per day.  Patient notes that she has a Fitbit but does not know how many steps per day she is currently getting.  Discussed goal of 7,000 steps per day.  Discussed my recommendation of utilizing myfitnesspal to track both dietary and exercise course. Colonoscopy on 06/03/23.  Insomnia- Patient patient notes that she has continued insomnia.  She does not feel that the Lunesta  has helped thus far.  She is agreeable to trying out a higher dose of Lunesta .  Discussed recommendation of less screen time before bed.  Has not been utilizing magnesium. Encouraged to use magnesium.  With concurrent low back pain, I suspect that Flexeril  will also help with sleep.  Lower Back Pain-  Patient believes that this could be a possible UTI- Dysuria? no Blood in urine? no Frequency? yes Incontinence? yes Abnormal color or odor of urine? yes Days of symptoms? 2 days Any fever? no Nausea? no Vomiting? no Back pain? Yes No real red flag signs or symptoms or saddle anesthesia.  Has been placed back on estradiol by OB/GYN.    ROS Negative unless indicated in HPI.    Objective:     BP 115/80 (BP Location: Right Arm, Patient Position: Sitting, Cuff Size: Large)   Pulse 78   Temp 98 F (36.7 C) (Oral)   Ht 5\' 7"  (1.702 m)   Wt 198 lb 4.8 oz (89.9 kg)   SpO2 97%   BMI 31.06 kg/m    Physical Exam Constitutional:      General: She is not in acute distress.    Appearance: Normal appearance. She is not ill-appearing.  HENT:     Head: Normocephalic and atraumatic.     Nose: Nose normal.     Mouth/Throat:      Mouth: Mucous membranes are moist.     Pharynx: Oropharynx is clear.  Eyes:     General: No scleral icterus.    Extraocular Movements: Extraocular movements intact.     Conjunctiva/sclera: Conjunctivae normal.     Pupils: Pupils are equal, round, and reactive to light.  Neck:     Vascular: No carotid bruit.  Cardiovascular:     Rate and Rhythm: Normal rate and regular rhythm.     Pulses: Normal pulses.     Heart sounds: Normal heart sounds. No murmur heard.    No friction rub.  Pulmonary:     Effort: Pulmonary effort is normal. No respiratory distress.     Breath sounds: Normal breath sounds. No wheezing, rhonchi or rales.  Abdominal:     Tenderness: There is left CVA tenderness. There is no right CVA tenderness.  Musculoskeletal:     Cervical back: Neck supple.     Comments: Low back tender to palpation, consistent with likely paraspinous muscle strain.  Lymphadenopathy:     Cervical: No cervical adenopathy.  Skin:    General: Skin is warm and dry.     Coloration: Skin is not jaundiced or pale.  Neurological:     General: No focal deficit present.  Mental Status: She is alert.  Psychiatric:        Mood and Affect: Mood normal.        Behavior: Behavior normal.      Results for orders placed or performed in visit on 05/19/23  POCT urinalysis dipstick  Result Value Ref Range   Color, UA YELLOW    Clarity, UA CLEAR    Glucose, UA Negative Negative   Bilirubin, UA NEGATIVE    Ketones, UA NEGATIVE    Spec Grav, UA 1.025 1.010 - 1.025   Blood, UA NEGATIVE    pH, UA 6.5 5.0 - 8.0   Protein, UA Positive (A) Negative   Urobilinogen, UA 0.2 0.2 or 1.0 E.U./dL   Nitrite, UA NEGATIVE    Leukocytes, UA Negative Negative   Appearance CLEAR    Odor NA       The 10-year ASCVD risk score (Arnett DK, et al., 2019) is: 2.2%* (Cholesterol units were assumed)    Assessment & Plan:   Weight loss -     Topiramate ; Take 1 tablet (25 mg total) by mouth 2 (two) times daily.   Dispense: 60 tablet; Refill: 3  Class 1 obesity due to excess calories without serious comorbidity with body mass index (BMI) of 31.0 to 31.9 in adult Assessment & Plan: Discussed diet and exercise. Recommend monitoring the spell. Recommend 7000 steps per day as goal. Send in topiramate , we will forego phentermine at this time due to concurrent insomnia that is not well-controlled.  Orders: -     Topiramate ; Take 1 tablet (25 mg total) by mouth 2 (two) times daily.  Dispense: 60 tablet; Refill: 3  Recurrent UTI (urinary tract infection) Assessment & Plan: History of the same. Point-of-care UA negative. Will send off urine culture.  Has recently been on MiraLAX  and leaking stool leading to possible suspected UTI.  Left-sided CVA pain-concern for pyelonephritis.  Orders: -     POCT urinalysis dipstick -     Urine Culture; Future  Insomnia due to other mental disorder Assessment & Plan: Patient was given CBT-I resources in the past. Increase Lunesta  to 2 mg tablet nightly. Discussed possibility of complex sleep behaviors. I suspect that Flexeril  for low back pain will also help with insomnia currently.  Orders: -     Eszopiclone ; Take 1 tablet (2 mg total) by mouth at bedtime as needed for sleep. Take immediately before bedtime  Dispense: 30 tablet; Refill: 0  Strain of lumbar region, initial encounter Assessment & Plan: Low back pain as well as left-sided CVA pain. Plan trial of Flexeril  due to likely strain of lumbar region. No red flags noted.  Orders: -     Cyclobenzaprine  HCl; Take 1 tablet (5 mg total) by mouth 3 (three) times daily as needed for muscle spasms.  Dispense: 30 tablet; Refill: 1   I have spent greater than 40 minutes charting, educating, diagnosing and managing this patient for this visit.  Return in about 6 weeks (around 06/30/2023) for Chronic Followup.    Charizma Gardiner T Tabia Landowski, PA-C

## 2023-05-19 NOTE — Assessment & Plan Note (Signed)
 Patient was given CBT-I resources in the past. Increase Lunesta  to 2 mg tablet nightly. Discussed possibility of complex sleep behaviors. I suspect that Flexeril  for low back pain will also help with insomnia currently.

## 2023-05-19 NOTE — Assessment & Plan Note (Signed)
 History of the same. Point-of-care UA negative. Will send off urine culture.  Has recently been on MiraLAX  and leaking stool leading to possible suspected UTI.  Left-sided CVA pain-concern for pyelonephritis.

## 2023-05-19 NOTE — Assessment & Plan Note (Signed)
 Low back pain as well as left-sided CVA pain. Plan trial of Flexeril  due to likely strain of lumbar region. No red flags noted.

## 2023-05-19 NOTE — Patient Instructions (Signed)
 It was nice to see you today!  As we discussed in clinic I will send in topiramate  (for weight loss), lunesta  2mg  (for sleep), and flexeril  (for muscles/sleep).  Cyclobenzaprine /Flexeril  is a centrally acting muscle relaxant, with most common side effects of drowsiness, dry mouth, fatigue, dizziness and others. Reports of arrhythmias, hypersensitive reactions are very rare. Patient should start it at low dose and see how it affects them. Cyclobenzaprine  is as and when needed medication, which can be taken up to 3 times a day.  If you have any problems before your next visit feel free to message me via MyChart (minor issues or questions) or call the office, otherwise you may reach out to schedule an office visit.  Thank you! Yareliz Thorstenson, PA-C

## 2023-05-19 NOTE — Assessment & Plan Note (Signed)
 Discussed diet and exercise. Recommend monitoring the spell. Recommend 7000 steps per day as goal. Send in topiramate , we will forego phentermine at this time due to concurrent insomnia that is not well-controlled.

## 2023-05-20 ENCOUNTER — Other Ambulatory Visit (INDEPENDENT_AMBULATORY_CARE_PROVIDER_SITE_OTHER): Payer: Medicare Other

## 2023-05-20 DIAGNOSIS — N390 Urinary tract infection, site not specified: Secondary | ICD-10-CM

## 2023-05-20 NOTE — Progress Notes (Signed)
Pt was here for urine culture collection

## 2023-05-20 NOTE — Telephone Encounter (Signed)
Pt will come leave a urine specimen.

## 2023-05-22 LAB — URINE CULTURE

## 2023-05-23 ENCOUNTER — Encounter (HOSPITAL_BASED_OUTPATIENT_CLINIC_OR_DEPARTMENT_OTHER): Payer: Self-pay | Admitting: Student

## 2023-05-26 ENCOUNTER — Encounter: Payer: Self-pay | Admitting: Gastroenterology

## 2023-05-29 ENCOUNTER — Ambulatory Visit (HOSPITAL_BASED_OUTPATIENT_CLINIC_OR_DEPARTMENT_OTHER): Payer: Medicare Other | Admitting: Student

## 2023-06-03 ENCOUNTER — Ambulatory Visit: Payer: Medicare Other | Admitting: Gastroenterology

## 2023-06-03 ENCOUNTER — Encounter: Payer: Self-pay | Admitting: Gastroenterology

## 2023-06-03 VITALS — BP 112/72 | HR 68 | Temp 97.3°F | Resp 13 | Ht 67.0 in | Wt 197.2 lb

## 2023-06-03 DIAGNOSIS — K5730 Diverticulosis of large intestine without perforation or abscess without bleeding: Secondary | ICD-10-CM | POA: Diagnosis not present

## 2023-06-03 DIAGNOSIS — Z1211 Encounter for screening for malignant neoplasm of colon: Secondary | ICD-10-CM | POA: Diagnosis not present

## 2023-06-03 DIAGNOSIS — D125 Benign neoplasm of sigmoid colon: Secondary | ICD-10-CM

## 2023-06-03 DIAGNOSIS — K648 Other hemorrhoids: Secondary | ICD-10-CM

## 2023-06-03 DIAGNOSIS — Z860100 Personal history of colon polyps, unspecified: Secondary | ICD-10-CM

## 2023-06-03 DIAGNOSIS — K635 Polyp of colon: Secondary | ICD-10-CM

## 2023-06-03 DIAGNOSIS — K644 Residual hemorrhoidal skin tags: Secondary | ICD-10-CM

## 2023-06-03 DIAGNOSIS — K5900 Constipation, unspecified: Secondary | ICD-10-CM

## 2023-06-03 DIAGNOSIS — Z860101 Personal history of adenomatous and serrated colon polyps: Secondary | ICD-10-CM

## 2023-06-03 MED ORDER — FLUCONAZOLE 100 MG PO TABS: 100.0000 mg | ORAL_TABLET | Freq: Every day | ORAL | 0 refills | Status: DC

## 2023-06-03 MED ORDER — SODIUM CHLORIDE 0.9 % IV SOLN
500.0000 mL | Freq: Once | INTRAVENOUS | Status: DC
Start: 2023-06-03 — End: 2023-06-03

## 2023-06-03 NOTE — Op Note (Signed)
 [facility] Patient Name: Barbara Edwards Procedure Date: 06/03/2023 2:11 PM MRN: [id number] Endoscopist: Napoleon Form , MD, [id number] Age: 63 Referring MD:  Date of Birth: 19-Feb-1961 Gender: Female Account #: [id number] Procedure:                Colonoscopy Indications:              High risk colon cancer surveillance: Personal                            history of colonic polyps Medicines:                Monitored Anesthesia Care Procedure:                Pre-Anesthesia Assessment:                           - Prior to the procedure, a History and Physical                            was performed, and patient medications and                            allergies were reviewed. The patient's tolerance of                            previous anesthesia was also reviewed. The risks                            and benefits of the procedure and the sedation                            options and risks were discussed with the patient.                            All questions were answered, and informed consent                            was obtained. Prior Anticoagulants: The patient has                            taken no anticoagulant or antiplatelet agents. ASA                            Grade Assessment: III - A patient with severe                            systemic disease. After reviewing the risks and                            benefits, the patient was deemed in satisfactory                            condition to undergo the procedure.  After obtaining informed consent, the colonoscope                            was passed under direct vision. Throughout the                            procedure, the patient's blood pressure, pulse, and                            oxygen saturations were monitored continuously. The                            PCF-HQ190L Colonoscope [id number] was introduced                            through the anus and advanced  to the the cecum,                            identified by appendiceal orifice and ileocecal                            valve. The colonoscopy was performed without                            difficulty. The patient tolerated the procedure                            well. The quality of the bowel preparation was                            good. The ileocecal valve, appendiceal orifice, and                            rectum were photographed. Scope In: 2:22:33 PM Scope Out: 2:37:37 PM Scope Withdrawal Time: 0 hours 8 minutes 56 seconds  Total Procedure Duration: 0 hours 15 minutes 4 seconds  Findings:                 The perianal and digital rectal examinations were                            normal.                           A 3 mm polyp was found in the sigmoid colon. The                            polyp was sessile. The polyp was removed with a                            cold snare. Resection and retrieval were complete.                           Scattered small-mouthed diverticula were found in  the sigmoid colon, descending colon, transverse                            colon and ascending colon.                           Non-bleeding external and internal hemorrhoids were                            found during retroflexion. The hemorrhoids were                            medium-sized. Complications:            No immediate complications. Estimated Blood Loss:     Estimated blood loss was minimal. Impression:               - One 3 mm polyp in the sigmoid colon, removed with                            a cold snare. Resected and retrieved.                           - Diverticulosis in the sigmoid colon, in the                            descending colon, in the transverse colon and in                            the ascending colon.                           - Non-bleeding external and internal hemorrhoids. Recommendation:           - Patient has a contact  number available for                            emergencies. The signs and symptoms of potential                            delayed complications were discussed with the                            patient. Return to normal activities tomorrow.                            Written discharge instructions were provided to the                            patient.                           - Resume previous diet.                           - Continue present medications.                           -  Await pathology results.                           - Repeat colonoscopy in 5-10 years for surveillance                            based on pathology results.                           - Rx Diflucan 100mg  daily X 3 days for vaginal                            yeast infection                           - Lactose free diet                           - If continues to have persistent abdominal                            bloating call office to make follow up appointment Napoleon Form, MD 06/03/2023 2:42:26 PM This report has been signed electronically.

## 2023-06-03 NOTE — Progress Notes (Unsigned)
 Sedate, gd SR, tolerated procedure well, VSS, report to RN

## 2023-06-03 NOTE — Progress Notes (Unsigned)
 Pt's states no medical or surgical changes since previsit or office visit.

## 2023-06-03 NOTE — Progress Notes (Unsigned)
  Gastroenterology History and Physical   Primary Care Physician:  Rothfuss, Teryl Lucy, PA-C   Reason for Procedure:  History of adenomatous colon polyps  Plan:    Surveillance colonoscopy with possible interventions as needed     HPI: Barbara Edwards is a very pleasant 63 y.o. female here for surveillance colonoscopy. Denies any nausea, vomiting, abdominal pain, melena or bright red blood per rectum  The risks and benefits as well as alternatives of endoscopic procedure(s) have been discussed and reviewed. All questions answered. The patient agrees to proceed.    Past Medical History:  Diagnosis Date   Acute pyelonephritis 07/04/2020   Anemia 06/22/2019   Anxiety    Arthritis 2018   Asthma    Bipolar disorder (HCC)    Chronic kidney disease 2015   Depression    Fibromyalgia    Fibromyalgia 11/17/2020   GERD (gastroesophageal reflux disease)    Headache    History of kidney stones    Hyperlipidemia 01/08/2023   Insomnia    Migraines    Osteoporosis    Prediabetes 01/08/2023   Shortness of breath 01/08/2023   Sleep apnea     Past Surgical History:  Procedure Laterality Date   ABDOMINAL HYSTERECTOMY  2018   partial, still has ovaries   APPENDECTOMY     CHOLECYSTECTOMY     TUBAL LIGATION  1990    Prior to Admission medications   Medication Sig Start Date End Date Taking? Authorizing Provider  b complex vitamins capsule Take 1 capsule by mouth daily. 11/22/22  Yes Garnette Gunner, MD  escitalopram (LEXAPRO) 10 MG tablet Take 10 mg by mouth daily. 05/24/20  Yes [provider]  estradiol (ESTRACE) 0.5 MG tablet Take 0.5 mg by mouth daily.   Yes [provider]  eszopiclone (LUNESTA) 2 MG TABS tablet Take 1 tablet (2 mg total) by mouth at bedtime as needed for sleep. Take immediately before bedtime 05/19/23  Yes Rothfuss, Teryl Lucy, PA-C  rosuvastatin (CRESTOR) 20 MG tablet Take 1 tablet (20 mg total) by mouth daily. 02/19/23  Yes Windell Norfolk, MD  Ascorbic Acid (VITAMIN C PO) Take by mouth. Patient not taking: Reported on 06/03/2023    [provider]  cyclobenzaprine (FLEXERIL) 5 MG tablet Take 1 tablet (5 mg total) by mouth 3 (three) times daily as needed for muscle spasms. 05/19/23   Rothfuss, Gerilyn Pilgrim T, PA-C  gabapentin (NEURONTIN) 300 MG capsule Take 300 mg by mouth at bedtime. 05/06/23   [provider]  MAGNESIUM PO Take by mouth. Patient not taking: Reported on 06/03/2023    [provider]  rizatriptan (MAXALT) 10 MG tablet Take 1 tablet (10 mg total) by mouth as needed for migraine. May repeat in 2 hours if needed 02/19/23   Windell Norfolk, MD  topiramate (TOPAMAX) 25 MG tablet Take 1 tablet (25 mg total) by mouth 2 (two) times daily. 05/19/23   Rothfuss, Teryl Lucy, PA-C    Current Outpatient Medications  Medication Sig Dispense Refill   b complex vitamins capsule Take 1 capsule by mouth daily.     escitalopram (LEXAPRO) 10 MG tablet Take 10 mg by mouth daily.     estradiol (ESTRACE) 0.5 MG tablet Take 0.5 mg by mouth daily.     eszopiclone (LUNESTA) 2 MG TABS tablet Take 1 tablet (2 mg total) by mouth at bedtime as needed for sleep. Take immediately before bedtime 30 tablet 0   rosuvastatin (CRESTOR) 20 MG tablet Take 1 tablet (  20 mg total) by mouth daily. 90 tablet 3   Ascorbic Acid (VITAMIN C PO) Take by mouth. (Patient not taking: Reported on 06/03/2023)     cyclobenzaprine (FLEXERIL) 5 MG tablet Take 1 tablet (5 mg total) by mouth 3 (three) times daily as needed for muscle spasms. 30 tablet 1   gabapentin (NEURONTIN) 300 MG capsule Take 300 mg by mouth at bedtime.     MAGNESIUM PO Take by mouth. (Patient not taking: Reported on 06/03/2023)     rizatriptan (MAXALT) 10 MG tablet Take 1 tablet (10 mg total) by mouth as needed for migraine. May repeat in 2 hours if needed 10 tablet 11   topiramate (TOPAMAX) 25 MG tablet Take 1 tablet (25 mg total) by mouth 2 (two) times daily. 60 tablet 3    Current Facility-Administered Medications  Medication Dose Route Frequency Provider Last Rate Last Admin   0.9 %  sodium chloride infusion  500 mL Intravenous Once Napoleon Form, MD        Allergies as of 06/03/2023 - Review Complete 06/03/2023  Allergen Reaction Noted   Sulfa antibiotics Diarrhea and Nausea Only 12/05/2020    Family History  Problem Relation Age of Onset   Anxiety disorder Mother    Arthritis Mother    Depression Mother    Kidney disease Mother    Alcohol abuse Father    Asthma Brother    Early death Brother    Heart disease Brother        passed away from CHF   Diabetes Maternal Aunt    Colon cancer Neg Hx    Colon polyps Neg Hx    Stomach cancer Neg Hx    Rectal cancer Neg Hx     Social History   Socioeconomic History   Marital status: Single    Spouse name: Not on file   Number of children: 2   Years of education: Not on file   Highest education level: Some college, no degree  Occupational History   Occupation: unemployed  Tobacco Use   Smoking status: Former    Types: Cigarettes    Passive exposure: Never   Smokeless tobacco: Never   Tobacco comments:    Quit in 2022  Vaping Use   Vaping status: Never Used  Substance and Sexual Activity   Alcohol use: Never   Drug use: Never   Sexual activity: Yes    Birth control/protection: Post-menopausal    Comment: estradoil   Other Topics Concern   Not on file  Social History Narrative   Not on file   Social Drivers of Health   Financial Resource Strain: Patient Declined (01/08/2023)   Overall Financial Resource Strain (CARDIA)    Difficulty of Paying Living Expenses: Patient declined  Food Insecurity: No Food Insecurity (04/16/2023)   Hunger Vital Sign    Worried About Running Out of Food in the Last Year: Never true    Ran Out of Food in the Last Year: Never true  Transportation Needs: No Transportation Needs (04/16/2023)   PRAPARE - Administrator, Civil Service  (Medical): No    Lack of Transportation (Non-Medical): No  Physical Activity: Sufficiently Active (01/08/2023)   Exercise Vital Sign    Days of Exercise per Week: 4 days    Minutes of Exercise per Session: 150+ min  Stress: Stress Concern Present (01/08/2023)   [employer] of Occupational Health - Occupational Stress Questionnaire    Feeling of Stress : Very much  Social Connections: Moderately Isolated (01/08/2023)   Social Connection and Isolation Panel [NHANES]    Frequency of Communication with Friends and Family: Once a week    Frequency of Social Gatherings with Friends and Family: Once a week    Attends Religious Services: More than 4 times per year    Active Member of [employer] or Organizations: Yes    Attends Engineer, structural: More than 4 times per year    Marital Status: Divorced  Intimate Partner Violence: Not At Risk (04/16/2023)   Humiliation, Afraid, Rape, and Kick questionnaire    Fear of Current or Ex-Partner: No    Emotionally Abused: No    Physically Abused: No    Sexually Abused: No    Review of Systems:  All other review of systems negative except as mentioned in the HPI.  Physical Exam: Vital signs in last 24 hours: BP 127/85   Pulse 85   Temp (!) 97.3 F (36.3 C)   Ht 5\' 7"  (1.702 m)   Wt 197 lb 3.2 oz (89.4 kg)   SpO2 99%   BMI 30.89 kg/m  General:   Alert, NAD Lungs:  Clear .   Heart:  Regular rate and rhythm Abdomen:  Soft, nontender and nondistended. Neuro/Psych:  Alert and cooperative. Normal mood and affect. A and O x 3  Reviewed labs, radiology imaging, old records and pertinent past GI work up  Patient is appropriate for planned procedure(s) and anesthesia in an ambulatory setting   K. Scherry Ran , MD [phone]

## 2023-06-03 NOTE — Progress Notes (Signed)
 Called to room to assist during endoscopic procedure.  Patient ID and intended procedure confirmed with present staff. Received instructions for my participation in the procedure from the performing physician.

## 2023-06-03 NOTE — Patient Instructions (Signed)
 Please read handouts provided. Continue present medications. Await pathology results. Resume previous diet. Lactose free diet. Diflucan 100 mg daily for 3 days. If persistent abdominal bloating call office for follow-up appointment.  YOU HAD AN ENDOSCOPIC PROCEDURE TODAY AT THE [facility]:   Refer to the procedure report that was given to you for any specific questions about what was found during the examination.  If the procedure report does not answer your questions, please call your gastroenterologist to clarify.  If you requested that your care partner not be given the details of your procedure findings, then the procedure report has been included in a sealed envelope for you to review at your convenience later.  YOU SHOULD EXPECT: Some feelings of bloating in the abdomen. Passage of more gas than usual.  Walking can help get rid of the air that was put into your GI tract during the procedure and reduce the bloating. If you had a lower endoscopy (such as a colonoscopy or flexible sigmoidoscopy) you may notice spotting of blood in your stool or on the toilet paper. If you underwent a bowel prep for your procedure, you may not have a normal bowel movement for a few days.  Please Note:  You might notice some irritation and congestion in your nose or some drainage.  This is from the oxygen used during your procedure.  There is no need for concern and it should clear up in a day or so.  SYMPTOMS TO REPORT IMMEDIATELY:  Following lower endoscopy (colonoscopy or flexible sigmoidoscopy):  Excessive amounts of blood in the stool  Significant tenderness or worsening of abdominal pains  Swelling of the abdomen that is new, acute  Fever of 100F or higher  For urgent or emergent issues, a gastroenterologist can be reached at any hour by calling [phone]. Do not use MyChart messaging for urgent concerns.    DIET:  We do recommend a small meal at first, but then you may proceed to  your regular diet.  Drink plenty of fluids but you should avoid alcoholic beverages for 24 hours.  ACTIVITY:  You should plan to take it easy for the rest of today and you should NOT DRIVE or use heavy machinery until tomorrow (because of the sedation medicines used during the test).    FOLLOW UP: Our staff will call the number listed on your records the next business day following your procedure.  We will call around 7:15- 8:00 am to check on you and address any questions or concerns that you may have regarding the information given to you following your procedure. If we do not reach you, we will leave a message.     If any biopsies were taken you will be contacted by phone or by letter within the next 1-3 weeks.  Please call us at [phone] if you have not heard about the biopsies in 3 weeks.    SIGNATURES/CONFIDENTIALITY: You and/or your care partner have signed paperwork which will be entered into your electronic medical record.  These signatures attest to the fact that that the information above on your After Visit Summary has been reviewed and is understood.  Full responsibility of the confidentiality of this discharge information lies with you and/or your care-partner.

## 2023-06-04 ENCOUNTER — Telehealth: Payer: Self-pay | Admitting: *Deleted

## 2023-06-04 NOTE — Telephone Encounter (Signed)
  Follow up Call-     06/03/2023    1:47 PM 04/13/2021    1:49 PM  Call back number  Post procedure Call Back phone  # [phone]  Permission to leave phone message Yes Yes     Patient questions:  Do you have a fever, pain , or abdominal swelling? No. Pain Score  0 *  Have you tolerated food without any problems? Yes.    Have you been able to return to your normal activities? Yes.    Do you have any questions about your discharge instructions: Diet   No. Medications  No. Follow up visit  No.  Do you have questions or concerns about your Care? No.  Actions: * If pain score is 4 or above: No action needed, pain <4.

## 2023-06-06 LAB — SURGICAL PATHOLOGY

## 2023-06-20 ENCOUNTER — Other Ambulatory Visit: Payer: Self-pay | Admitting: Neurology

## 2023-07-01 ENCOUNTER — Ambulatory Visit (HOSPITAL_BASED_OUTPATIENT_CLINIC_OR_DEPARTMENT_OTHER): Payer: Medicare Other | Admitting: Student

## 2023-08-05 ENCOUNTER — Telehealth (HOSPITAL_BASED_OUTPATIENT_CLINIC_OR_DEPARTMENT_OTHER): Admitting: Student

## 2023-08-05 ENCOUNTER — Ambulatory Visit: Payer: Self-pay

## 2023-08-05 ENCOUNTER — Encounter (HOSPITAL_BASED_OUTPATIENT_CLINIC_OR_DEPARTMENT_OTHER): Payer: Self-pay | Admitting: Student

## 2023-08-05 DIAGNOSIS — F410 Panic disorder [episodic paroxysmal anxiety] without agoraphobia: Secondary | ICD-10-CM | POA: Insufficient documentation

## 2023-08-05 DIAGNOSIS — F5105 Insomnia due to other mental disorder: Secondary | ICD-10-CM

## 2023-08-05 DIAGNOSIS — F419 Anxiety disorder, unspecified: Secondary | ICD-10-CM

## 2023-08-05 MED ORDER — SUVOREXANT 10 MG PO TABS: 1.0000 | ORAL_TABLET | Freq: Every evening | ORAL | 3 refills | Status: DC

## 2023-08-05 MED ORDER — ESCITALOPRAM OXALATE 20 MG PO TABS
20.0000 mg | ORAL_TABLET | Freq: Every day | ORAL | 6 refills | Status: AC
Start: 2023-08-05 — End: ?

## 2023-08-05 MED ORDER — BUSPIRONE HCL 7.5 MG PO TABS: 7.5000 mg | ORAL_TABLET | Freq: Two times a day (BID) | ORAL | 5 refills | Status: AC

## 2023-08-05 NOTE — Assessment & Plan Note (Signed)
 Patient was given CBT-I resources in the past. Patient continues to have symptoms of insomnia. - Failed trazodone and Lunesta  - Order suvorexant and assess for therapeutic achievement. - Contact if any side effects occur.

## 2023-08-05 NOTE — Patient Instructions (Addendum)
 It was nice to see you today!  As we discussed in clinic:  Please let me know how things go on the suvorexant.  We will start you on BuSpar 7.5 mg twice daily in addition to increasing your Lexapro  to 20 mg daily.  My hope is that this will decrease panic attacks and need for any as needed medications.  You can call [facility] at [phone], you did not need a referral from us  in order to follow-up with psych.  Let me know if you have any issues.  If you have any problems before your next visit feel free to message me via MyChart (minor issues or questions) or call the office, otherwise you may reach out to schedule an office visit.  Thank you! Sofiya Ezelle, PA-C

## 2023-08-05 NOTE — Assessment & Plan Note (Signed)
 Continued symptoms of anxiety that are not well controlled on current therapy.  - Order BuSpar 7.5 mg twice daily - Increase Lexapro  to 20 mg daily. - Failed hydroxyzine, propranolol, and gabapentin  in the past.

## 2023-08-05 NOTE — Progress Notes (Unsigned)
 Virtual Visit via Video Note  I connected with Barbara Edwards on 08/05/23 at  3:50 PM EDT by a video enabled telemedicine application and it was verified that I spoke with the correct person. Total video time was : 75m 11s.  Patient Location: Home Provider Location: Office/Clinic  Patient is aware of the limitations, risks, security, and privacy concerns of performing an evaluation and management service by video and the availability of in person appointments. Patient is aware that there may be a patient responsible charge related to this service.  Subjective: PCP: Maddalyn Lutze, Laron Plummer, PA-C  Chief Complaint  Patient presents with   Panic Attack    Needs to discuss high anxiety, panic attacks, and trouble sleeping.   HPI Anxiety/Panic Attacks- Patient notes that she is having severe anxiety and panic attacks daily. She feels that she has begun to isolate herself. She states that "Anxiety is part of her every day life." She has tried hydroxyzine, propranolol, and gabapentin  (though this was at a dosage for pain) without effect. Patient would like valium  as she states that this is the only thing that worked for her in the past. Discussed risks associated with valium  on memory and falls as she continues to age.   Insomnia- She continues to have issues with sleeping. She states that she is only getting around 3-4 hours nightly. Patient states that it seems like her brain will not stop and she ends up simply watching the clock.  ROS: Per HPI  Current Outpatient Medications:    Ascorbic Acid (VITAMIN C PO), Take by mouth., Disp: , Rfl:    b complex vitamins capsule, Take 1 capsule by mouth daily. (Patient taking differently: Take 1 capsule by mouth daily. UNDER TONGUE), Disp: , Rfl:    busPIRone (BUSPAR) 7.5 MG tablet, Take 1 tablet (7.5 mg total) by mouth 2 (two) times daily., Disp: 60 tablet, Rfl: 5   cefdinir (OMNICEF) 300 MG capsule, Take 300 mg by mouth 2 (two) times daily., Disp: , Rfl:     MAGNESIUM PO, Take by mouth as needed., Disp: , Rfl:    omeprazole (PRILOSEC) 40 MG capsule, Take 40 mg by mouth daily., Disp: , Rfl:    rizatriptan  (MAXALT ) 10 MG tablet, Take 1 tablet (10 mg total) by mouth as needed for migraine. May repeat in 2 hours if needed, Disp: 10 tablet, Rfl: 11   rosuvastatin  (CRESTOR ) 20 MG tablet, Take 1 tablet (20 mg total) by mouth daily., Disp: 90 tablet, Rfl: 3   Suvorexant 10 MG TABS, Take 1 tablet (10 mg total) by mouth at bedtime. 30 minutes before bedtime., Disp: 30 tablet, Rfl: 3   cyclobenzaprine  (FLEXERIL ) 5 MG tablet, Take 1 tablet (5 mg total) by mouth 3 (three) times daily as needed for muscle spasms. (Patient not taking: Reported on 08/05/2023), Disp: 30 tablet, Rfl: 1   escitalopram  (LEXAPRO ) 20 MG tablet, Take 1 tablet (20 mg total) by mouth daily., Disp: 30 tablet, Rfl: 6   estradiol (ESTRACE) 0.5 MG tablet, Take 0.5 mg by mouth daily. (Patient not taking: Reported on 08/05/2023), Disp: , Rfl:    eszopiclone  (LUNESTA ) 2 MG TABS tablet, Take 1 tablet (2 mg total) by mouth at bedtime as needed for sleep. Take immediately before bedtime (Patient not taking: Reported on 08/05/2023), Disp: 30 tablet, Rfl: 0   fluconazole  (DIFLUCAN ) 100 MG tablet, Take 1 tablet (100 mg total) by mouth daily. Diflucan  100 mg daily for 3 days. (Patient not taking: Reported on 08/05/2023),  Disp: 5 tablet, Rfl: 0   gabapentin  (NEURONTIN ) 300 MG capsule, Take 300 mg by mouth at bedtime. (Patient not taking: Reported on 08/05/2023), Disp: , Rfl:    topiramate  (TOPAMAX ) 25 MG tablet, Take 1 tablet (25 mg total) by mouth 2 (two) times daily. (Patient not taking: Reported on 08/05/2023), Disp: 60 tablet, Rfl: 3  Observations/Objective: There were no vitals filed for this visit. Physical Exam Constitutional:      General: She is not in acute distress.    Appearance: Normal appearance. She is not ill-appearing.  HENT:     Head: Normocephalic and atraumatic.     Nose: Nose normal.   Eyes:     General: No scleral icterus.    Conjunctiva/sclera: Conjunctivae normal.  Pulmonary:     Effort: Pulmonary effort is normal.     Breath sounds: Normal breath sounds.  Skin:    Coloration: Skin is not jaundiced or pale.  Neurological:     Mental Status: She is alert.  Psychiatric:        Mood and Affect: Mood normal.        Behavior: Behavior normal.     Assessment and Plan: Panic attack Assessment & Plan: Continued symptoms of anxiety that are not well controlled on current therapy.  - Order BuSpar 7.5 mg twice daily - Increase Lexapro  to 20 mg daily. - Failed hydroxyzine, propranolol, and gabapentin  in the past.  Orders: -     Escitalopram  Oxalate; Take 1 tablet (20 mg total) by mouth daily.  Dispense: 30 tablet; Refill: 6 -     busPIRone HCl; Take 1 tablet (7.5 mg total) by mouth 2 (two) times daily.  Dispense: 60 tablet; Refill: 5  Anxiety Assessment & Plan: Continued symptoms of anxiety that are not well controlled on current therapy.  - Order BuSpar 7.5 mg twice daily - Increase Lexapro  to 20 mg daily.  Orders: -     Escitalopram  Oxalate; Take 1 tablet (20 mg total) by mouth daily.  Dispense: 30 tablet; Refill: 6 -     busPIRone HCl; Take 1 tablet (7.5 mg total) by mouth 2 (two) times daily.  Dispense: 60 tablet; Refill: 5  Insomnia due to other mental disorder Assessment & Plan: Patient was given CBT-I resources in the past. Patient continues to have symptoms of insomnia. - Failed trazodone and Lunesta  - Order suvorexant and assess for therapeutic achievement. - Contact if any side effects occur.  Orders: -     Suvorexant; Take 1 tablet (10 mg total) by mouth at bedtime. 30 minutes before bedtime.  Dispense: 30 tablet; Refill: 3    Follow Up Instructions: Return if symptoms worsen or fail to improve.   I discussed the assessment and treatment plan with the patient. The patient was provided an opportunity to ask questions, and all were answered.  The patient agreed with the plan and demonstrated an understanding of the instructions.   The patient should call back or seek an in-person evaluation if the symptoms worsen or if the condition fails to improve as anticipated.  The assessment and management plan was discussed with the patient. The patient verbalized understanding of and has agreed to the management plan.   Nanako Stopher T Shatoria Stooksbury, PA-C

## 2023-08-05 NOTE — Progress Notes (Deleted)
   Acute Office Visit  Subjective:     Patient ID: Barbara Edwards, female    DOB: [birth year], 63 y.o.   MRN: [id number]  Chief Complaint  Patient presents with   Panic Attack    Needs to discuss high anxiety, panic attacks, and trouble sleeping.    HPI  Anxiety/Panic Attacks- Patient notes that she is having severe anxiety and panic attacks daily. She feels that she has begun to isolate herself. She states that "Anxiety is part of her every day life." She has tried hydroxyzine, propranolol, and gabapentin  (though this was at a dosage for pain) without effect. Patient would like valium  as she states that this is the only thing that worked for her in the past. Discussed risks associated with valium  on memory and falls as she continues to age.  Insomnia- She continues to have issues with sleeping. She states that she is only getting around 3-4 hours nightly. Patient states that it seems like her brain will not stop and she ends up simply watching the clock.  ROS Per HPI     Objective:    There were no vitals taken for this visit. {Vitals History (Optional):23777}  Physical Exam Constitutional:      General: She is not in acute distress.    Appearance: Normal appearance. She is not ill-appearing.  HENT:     Head: Normocephalic and atraumatic.     Nose: Nose normal.  Eyes:     General: No scleral icterus.    Conjunctiva/sclera: Conjunctivae normal.  Cardiovascular:     Rate and Rhythm: Normal rate and regular rhythm.     Heart sounds: Normal heart sounds. No murmur heard.    No friction rub.  Pulmonary:     Effort: Pulmonary effort is normal. No respiratory distress.     Breath sounds: Normal breath sounds. No wheezing, rhonchi or rales.  Musculoskeletal:        General: Normal range of motion.  Skin:    General: Skin is warm and dry.     Coloration: Skin is not jaundiced or pale.  Neurological:     General: No focal deficit present.     Mental Status: She is alert.   Psychiatric:        Mood and Affect: Mood normal.        Behavior: Behavior normal.     No results found for any visits on 08/05/23.      Assessment & Plan:   Insomnia - Patient continues to have symptoms of insomnia. - Failed trazodone and Lunesta  -Order suvorexant and assess for therapeutic achievement. - Contact if any side effects occur.  Anxiety/Panic Attacks - BuSpar 7.5 mg twice daily - Increase Lexapro  to 20 mg daily. - Contact if any side effects occur.   No follow-ups on file.  Janeece Blok T Staley Budzinski, PA-C

## 2023-08-05 NOTE — Telephone Encounter (Signed)
 Chief Complaint: Anxiety, Panic attacks, dread Symptoms: see notes Frequency: ongoing since last year Pertinent Negatives: Patient denies thoughts of harming herself or others Disposition: [] ED /[] Urgent Care (no appt availability in office) / [x] Appointment(In office/virtual)/ []  [facility]/ [] Home Care/ [] Refused Recommended Disposition /[] [facility]/ []  Follow-up with PCP Additional Notes: Patient called in stating she is having severe anxiety/panic attacks. Patient states she mentioned it to PCP last year but the focus was moreso around her insomnia, but her anxiety and panic attacks have worsened. Patient was tearful on the phone and stated she does not have thoughts of harming herself but she does have feeling of dread. Patient does have history of PTSD as well. Patient essentially seeking a referral to psychiatry asap. Patient stated she had called in in the past to ask for this and never heard back. Patient appt made for today for virtual appt to discuss with PCP.   Reason for Disposition  Patient sounds very upset or troubled to the triager  Answer Assessment - Initial Assessment Questions 1. CONCERN: "Did anything happen that prompted you to call today?"      "Anything can cause me to have an anxiety attack" 2. ANXIETY SYMPTOMS: "Can you describe how you (your loved one; patient) have been feeling?" (e.g., tense, restless, panicky, anxious, keyed up, overwhelmed, sense of impending doom).      "Dont sleep well, shaky, tremble, heartrate goes up, feel like it stops her from her issues" 3. ONSET: "How long have you been feeling this way?" (e.g., hours, days, weeks)     "Been a while, focused on insomnia but anxiety has been bad" 4. SEVERITY: "How would you rate the level of anxiety?" (e.g., 0 - 10; or mild, moderate, severe).     Severe 5. FUNCTIONAL IMPAIRMENT: "How have these feelings affected your ability to do daily activities?" "Have you had more difficulty  than usual doing your normal daily activities?" (e.g., getting better, same, worse; self-care, school, work, interactions)     Yes it impairs daily activity 6. HISTORY: "Have you felt this way before?" "Have you ever been diagnosed with an anxiety problem in the past?" (e.g., generalized anxiety disorder, panic attacks, PTSD). If Yes, ask: "How was this problem treated?" (e.g., medicines, counseling, etc.)     History of PTSD 7. RISK OF HARM - SUICIDAL IDEATION: "Do you ever have thoughts of hurting or killing yourself?" If Yes, ask:  "Do you have these feelings now?" "Do you have a plan on how you would do this?"     No  8. TREATMENT:  "What has been done so far to treat this anxiety?" (e.g., medicines, relaxation strategies). "What has helped?"     N/a 9. TREATMENT - THERAPIST: "Do you have a counselor or therapist? Name?"     No 10. POTENTIAL TRIGGERS: "Do you drink caffeinated beverages (e.g., coffee, colas, teas), and how much daily?" "Do you drink alcohol or use any drugs?" "Have you started any new medicines recently?"       Daily triggers such as car AC going out 11. PATIENT SUPPORT: "Who is with you now?" "Who do you live with?" "Do you have family or friends who you can talk to?"        Patient has family support  11. OTHER SYMPTOMS: "Do you have any other symptoms?" (e.g., feeling depressed, trouble concentrating, trouble sleeping, trouble breathing, palpitations or fast heartbeat, chest pain, sweating, nausea, or diarrhea)       Trouble sleeping,  increased heartrate, nervousness causes shortness of breath  Protocols used: Anxiety and Panic Attack-A-AH

## 2023-08-05 NOTE — Assessment & Plan Note (Signed)
 Continued symptoms of anxiety that are not well controlled on current therapy.  - Order BuSpar 7.5 mg twice daily - Increase Lexapro  to 20 mg daily.

## 2023-08-06 ENCOUNTER — Telehealth (HOSPITAL_BASED_OUTPATIENT_CLINIC_OR_DEPARTMENT_OTHER): Payer: Self-pay | Admitting: Student

## 2023-08-06 ENCOUNTER — Encounter (HOSPITAL_BASED_OUTPATIENT_CLINIC_OR_DEPARTMENT_OTHER): Payer: Self-pay

## 2023-08-06 NOTE — Telephone Encounter (Unsigned)
 Copied from CRM [id number]. Topic: Clinical - Medication Question >> Aug 06, 2023  2:31 PM Rosaria Common wrote: Reason for CRM:  Patient is calling in because she was prescribed some medication yesterday Belsomra and wanted to speak with her provider's nurse regarding it. Patient says the insurance is not covering the entire medication and she cannot afford it.

## 2023-08-06 NOTE — Telephone Encounter (Unsigned)
 Copied from CRM [id number]. Topic: Clinical - Prescription Issue >> Aug 06, 2023 11:05 AM Hobson Luna F wrote: Reason for CRM: Patient is calling in because she was prescribed some medication yesterday and wanted to speak with her provider's nurse regarding it. Patient says the insurance is not covering the entire medication and she cannot afford it.

## 2023-08-07 ENCOUNTER — Other Ambulatory Visit (HOSPITAL_BASED_OUTPATIENT_CLINIC_OR_DEPARTMENT_OTHER): Payer: Self-pay | Admitting: Student

## 2023-08-07 ENCOUNTER — Encounter (HOSPITAL_BASED_OUTPATIENT_CLINIC_OR_DEPARTMENT_OTHER): Payer: Self-pay | Admitting: Student

## 2023-08-07 DIAGNOSIS — F5105 Insomnia due to other mental disorder: Secondary | ICD-10-CM

## 2023-08-07 MED ORDER — QUETIAPINE FUMARATE 25 MG PO TABS
25.0000 mg | ORAL_TABLET | Freq: Every day | ORAL | 5 refills | Status: DC
Start: 2023-08-07 — End: 2024-02-17

## 2023-08-07 NOTE — Progress Notes (Signed)
 Patient would like something for sleep has tried multiple drugs now. Prefer seroquel  to mirtazapine unless we decrease the lexapro  dosing. Will call in low dose seroquel  for bed time. Discussed signs and symptoms of serotonin syndrome and reasons to head to the ER while on these medications. Patient was agreeable.

## 2023-08-14 ENCOUNTER — Encounter: Payer: Self-pay | Admitting: Gastroenterology

## 2023-08-24 ENCOUNTER — Encounter

## 2023-08-24 NOTE — ED Triage Notes (Signed)
 Pt states she swallowed a piece of cantaloupe and now it is stuck in her throat. Airway is not compromised.  She is able to swallow some spit, though it is difficult.   Hr 70 Bp 140/82 Sats 100% ra

## 2023-09-15 ENCOUNTER — Encounter (HOSPITAL_BASED_OUTPATIENT_CLINIC_OR_DEPARTMENT_OTHER): Payer: Self-pay | Admitting: Student

## 2023-09-15 ENCOUNTER — Ambulatory Visit (INDEPENDENT_AMBULATORY_CARE_PROVIDER_SITE_OTHER): Admitting: Student

## 2023-09-15 VITALS — BP 126/85 | HR 105 | Temp 98.3°F | Resp 16 | Ht 67.0 in | Wt 186.6 lb

## 2023-09-15 DIAGNOSIS — G2581 Restless legs syndrome: Secondary | ICD-10-CM | POA: Diagnosis not present

## 2023-09-15 DIAGNOSIS — J029 Acute pharyngitis, unspecified: Secondary | ICD-10-CM

## 2023-09-15 DIAGNOSIS — J0110 Acute frontal sinusitis, unspecified: Secondary | ICD-10-CM

## 2023-09-15 LAB — POCT RAPID STREP A (OFFICE): Rapid Strep A Screen: NEGATIVE

## 2023-09-15 MED ORDER — GABAPENTIN 300 MG PO CAPS: 300.0000 mg | ORAL_CAPSULE | Freq: Every day | ORAL | 3 refills | Status: DC

## 2023-09-15 MED ORDER — AMOXICILLIN-POT CLAVULANATE 875-125 MG PO TABS: 1.0000 | ORAL_TABLET | Freq: Two times a day (BID) | ORAL | 0 refills | Status: AC

## 2023-09-15 NOTE — Patient Instructions (Addendum)
 It was nice to see you today!  Please make sure to drink plenty of fluids.  Try tylenol  or ibuprofen  to reduce fever as needed.  You can use nasal saline or a netti pot to decrease congestion.  Please finish the antibiotic as prescribed and let me know if you are having issues after finishing.  If you have any problems before your next visit feel free to message me via MyChart (minor issues or questions) or call the office, otherwise you may reach out to schedule an office visit.  Thank you! Holt Woolbright, PA-C

## 2023-09-15 NOTE — Progress Notes (Signed)
 Acute Office Visit  Subjective:     Patient ID: Barbara Edwards, female    DOB: 06-14-1960, 63 y.o.   MRN: [id number]  Chief Complaint  Patient presents with   Sore Throat    Has had sore throat, cough, and congestion for 9 days. Tried gargling with salt water, hot tea, and tylenol . Refill needed for gabapentin .     HPI  Discussed the use of AI scribe software for clinical note transcription with the patient, who gave verbal consent to proceed.  History of Present Illness   Barbara Edwards is a 63 year old female who presents with hoarseness, cough, and congestion.  She has been experiencing hoarseness, cough, and congestion for the past nine days. Initially, she had a sore throat which she attempted to alleviate by gargling with salt water. Although the symptoms seemed to improve, they returned with increased severity. The congestion is described as producing green mucus, and she has a major cough.  She denies any facial pressure but reports significant throat pain. She has been around her grandchildren, one of whom had bronchitis, which may have contributed to her current symptoms. She has experienced fever, chills, and sweats, with the fever waking her up at night, particularly worsening last night.  She reports tenderness in her neck and some fullness in her ears, particularly on the right side, but denies any ear pain. She has not been using any nasal saline or other medications for congestion but has been using Tylenol  or ibuprofen  for fever management.  Her symptoms initially seemed to improve but have worsened again. She has not noticed any white exudate in her throat, and a recent strep test was negative.      ROS Per HPI     Objective:    BP 126/85   Pulse (!) 105   Temp 98.3 F (36.8 C) (Oral)   Resp 16   Ht 5\' 7"  (1.702 m)   Wt 186 lb 9.6 oz (84.6 kg)   SpO2 96%   BMI 29.23 kg/m    Physical Exam Constitutional:      General: She is not in acute  distress.    Appearance: Normal appearance. She is not ill-appearing.  HENT:     Head: Normocephalic and atraumatic.     Comments: Pain to palpation over frontal and maxillary sinuses    Nose: Nose normal.     Mouth/Throat:     Mouth: Mucous membranes are moist. No oral lesions.     Pharynx: Posterior oropharyngeal erythema present. No pharyngeal swelling or oropharyngeal exudate.     Tonsils: No tonsillar exudate.  Eyes:     General: No scleral icterus.    Conjunctiva/sclera: Conjunctivae normal.  Cardiovascular:     Rate and Rhythm: Regular rhythm. Tachycardia present.     Heart sounds: Normal heart sounds. No murmur heard.    No friction rub.     Comments: Consistent with recurrent anxiety issues Pulmonary:     Effort: Pulmonary effort is normal. No respiratory distress.     Breath sounds: Normal breath sounds. No wheezing, rhonchi or rales.  Musculoskeletal:        General: Normal range of motion.  Lymphadenopathy:     Cervical: Cervical adenopathy present.  Skin:    General: Skin is warm and dry.     Coloration: Skin is not jaundiced or pale.  Neurological:     General: No focal deficit present.     Mental Status: She is alert.  Psychiatric:        Mood and Affect: Mood normal.        Behavior: Behavior normal.     Results for orders placed or performed in visit on 09/15/23  POCT rapid strep A  Result Value Ref Range   Rapid Strep A Screen Negative Negative        Assessment & Plan:   Assessment and Plan    Acute Sinusitis Symptoms consistent with acute sinusitis include hoarseness, cough, congestion with green mucus, sore throat, and facial tenderness, persisting for nine days with recent worsening, suggesting bacterial infection. Reports fever, chills, and night sweats indicate systemic involvement. Differential diagnosis considered viral upper respiratory infection predisposing to secondary bacterial sinusitis. Absence of significant lung findings and  negative strep test support sinusitis diagnosis over pneumonia or streptococcal pharyngitis. Discussed chest x-ray to rule out pneumonia, but she opted to wait and see if symptoms improve with antibiotics due to cost concerns and current clinical presentation not strongly indicating pneumonia. - Prescribe Augmentin  for bacterial sinusitis. - Advise use of acetaminophen  or ibuprofen  for fever and pain management. - Recommend nasal saline for mucus clearance. - Reassess if no improvement with antibiotics.      Return if symptoms worsen or fail to improve.  Baili Stang T Godric Lavell, PA-C

## 2023-09-17 ENCOUNTER — Telehealth: Payer: Self-pay | Admitting: Neurology

## 2023-09-17 NOTE — Telephone Encounter (Signed)
 Patient cancelled appointment due to transportation issues

## 2023-09-22 ENCOUNTER — Ambulatory Visit: Payer: Medicare Other | Admitting: Neurology

## 2024-02-17 ENCOUNTER — Ambulatory Visit: Payer: Self-pay | Admitting: *Deleted

## 2024-02-17 ENCOUNTER — Ambulatory Visit (INDEPENDENT_AMBULATORY_CARE_PROVIDER_SITE_OTHER): Admitting: Family Medicine

## 2024-02-17 ENCOUNTER — Encounter (HOSPITAL_BASED_OUTPATIENT_CLINIC_OR_DEPARTMENT_OTHER): Payer: Self-pay | Admitting: Family Medicine

## 2024-02-17 ENCOUNTER — Ambulatory Visit: Payer: Self-pay

## 2024-02-17 VITALS — BP 137/84 | HR 78 | Temp 98.3°F | Resp 16 | Wt 174.2 lb

## 2024-02-17 DIAGNOSIS — F410 Panic disorder [episodic paroxysmal anxiety] without agoraphobia: Secondary | ICD-10-CM | POA: Diagnosis not present

## 2024-02-17 DIAGNOSIS — M4802 Spinal stenosis, cervical region: Secondary | ICD-10-CM | POA: Insufficient documentation

## 2024-02-17 DIAGNOSIS — F39 Unspecified mood [affective] disorder: Secondary | ICD-10-CM | POA: Diagnosis not present

## 2024-02-17 DIAGNOSIS — M5412 Radiculopathy, cervical region: Secondary | ICD-10-CM | POA: Insufficient documentation

## 2024-02-17 MED ORDER — LAMOTRIGINE 25 MG PO TABS: 25.0000 mg | ORAL_TABLET | Freq: Every day | ORAL | 1 refills | Status: AC

## 2024-02-17 MED ORDER — ALPRAZOLAM 0.25 MG PO TABS: 0.2500 mg | ORAL_TABLET | Freq: Two times a day (BID) | ORAL | 0 refills | Status: AC | PRN

## 2024-02-17 NOTE — Telephone Encounter (Signed)
 FYI Only or Action Required?: FYI only for provider: appointment scheduled on 11/11.  Patient was last seen in primary care on 09/15/2023 by Rothfuss, Lang DASEN, PA-C.  Called Nurse Triage reporting Anxiety.  Symptoms began several days ago.   Triage Disposition: See PCP within 24 hours  Patient/caregiver understands and will follow disposition?: Yes  **Patient spoke with triage earlier regarding increased anxiety, patient has an appt previously scheduled  for today 11/11 for evaluation.        Copied from CRM [id number]. Topic: Clinical - Red Word Triage >> Feb 17, 2024 10:53 AM Charlet HERO wrote: Red Word that prompted transfer to Nurse Triage: Patient is calling bc she is having bad anxiety attacks. She is stating that they have been for weeks

## 2024-02-17 NOTE — Telephone Encounter (Signed)
 Patient is at [facility] having shoulder x-ray- she has to end call- she will call back when she gets out of exam. Call ended and placed in call back for follow up  Copied from CRM [id number]. Topic: Clinical - Red Word Triage >> Feb 17, 2024 10:53 AM Charlet HERO wrote: Red Word that prompted transfer to Nurse Triage: Patient is calling bc she is having bad anxiety attacks. She is stating that they have been for weeks Answer Assessment - Initial Assessment Questions 1. CONCERN: Did anything happen that prompted you to call today?      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 ANXIETY SYMPTOMS: Can you describe how you (your loved one; patient) have been feeling? (e.g., tense, restless, panicky, anxious, keyed up, overwhelmed, sense of impending doom).      Waking with anxiety, feels like heart is going come out of chest, racing heart 3. ONSET: How long have you been feeling this way? (e.g., hours, days, weeks)     1-2 weeks 4. SEVERITY: How would you rate the level of anxiety? (e.g., 0 - 10; or mild, moderate, severe).     9/10 5. FUNCTIONAL IMPAIRMENT: How have these feelings affected your ability to do daily activities? Have you had more difficulty than usual doing your normal daily activities? (e.g., getting better, same, worse; self-care, school, work, interactions)     *No Answer* 6. HISTORY: Have you felt this way before? Have you ever been diagnosed with an anxiety problem in the past? (e.g., generalized anxiety disorder, panic attacks, PTSD). If Yes, ask: How was this problem treated? (e.g., medicines, counseling, etc.)     *No Answer* 7. RISK OF HARM - SUICIDAL IDEATION: Do you ever have thoughts of hurting or killing yourself? If Yes, ask:  Do you have these feelings now? Do you have a plan on how you would do this?     *No Answer* 8. TREATMENT:  What has been done so far to treat this anxiety? (e.g., medicines, relaxation  strategies). What has helped?     *No Answer* 9. THERAPIST: Do you have a counselor or therapist? If Yes, ask: What is their name?     *No Answer* 10. POTENTIAL TRIGGERS: Do you drink caffeinated beverages (e.g., coffee, colas, teas), and how much daily? Do you drink alcohol or use any drugs? Have you started any new medicines recently?       *No Answer* 11. PATIENT SUPPORT: Who is with you now? Who do you live with? Do you have family or friends who you can talk to?        *No Answer* 12. OTHER SYMPTOMS: Do you have any other symptoms? (e.g., feeling depressed, trouble concentrating, trouble sleeping, trouble breathing, palpitations or fast heartbeat, chest pain, sweating, nausea, or diarrhea)       *No Answer* 13. PREGNANCY: Is there any chance you are pregnant? When was your last menstrual period?       *No Answer*  Protocols used: Anxiety and Panic Attack-A-AH

## 2024-02-17 NOTE — Assessment & Plan Note (Signed)
Continue Lexapro for now.

## 2024-02-17 NOTE — Telephone Encounter (Signed)
 FYI Only or Action Required?: FYI only for provider: appointment scheduled on 02/17/2024 at 1:30pm with Dr Norleen Fearing II at PCP office .  Patient was last seen in primary care on 09/15/2023 by Rothfuss, Lang DASEN, PA-C.  Called Nurse Triage reporting Panic Attack.  Symptoms began 1-2 weeks ago.  Interventions attempted: Rest, hydration, or home remedies.  Symptoms are: gradually worsening.  Triage Disposition: See Physician Within 24 Hours  Patient/caregiver understands and will follow disposition?: Yes            Copied from CRM #[id number]. Topic: Clinical - Red Word Triage >> Feb 17, 2024 12:16 PM Wess RAMAN wrote: Red Word that prompted transfer to Nurse Triage: Anxiety attacks daily for the last month. They have gotten worse in the last 2 weeks. Heart racing. Reason for Disposition  Patient sounds very upset or troubled to the triager  Answer Assessment - Initial Assessment Questions Patient states she forces herself to eat---she doesn't have much of an appetite She denies any thoughts about harming herself or anyone else She wants to see a provider at her PCP office to discuss these symptoms to receive help to get these anxiety attacks under control Patient is advised to call us  back if anything changes or with any further questions/concerns. Patient is advised that if anything worsens to go to the Emergency Room. Patient verbalized understanding.    1. CONCERN: Did anything happen that prompted you to call today?      Increased anxiety recently lost  2. ANXIETY SYMPTOMS: Can you describe how you (your loved one; patient) have been feeling? (e.g., tense, restless, panicky, anxious, keyed up, overwhelmed, sense of impending doom).      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 ONSET: How long have you been feeling this way? (e.g., hours, days, weeks)     1-2 weeks 4. SEVERITY: How would you rate the level of anxiety? (e.g., 0 -  10; or mild, moderate, severe).     9/10 5. FUNCTIONAL IMPAIRMENT: How have these feelings affected your ability to do daily activities? Have you had more difficulty than usual doing your normal daily activities? (e.g., getting better, same, worse; self-care, school, work, interactions)     ------ 6. HISTORY: Have you felt this way before? Have you ever been diagnosed with an anxiety problem in the past? (e.g., generalized anxiety disorder, panic attacks, PTSD). If Yes, ask: How was this problem treated? (e.g., medicines, counseling, etc.)     Lexapro  7. RISK OF HARM - SUICIDAL IDEATION: Do you ever have thoughts of hurting or killing yourself? If Yes, ask:  Do you have these feelings now? Do you have a plan on how you would do this?     Patient denies but states  8. TREATMENT:  What has been done so far to treat this anxiety? (e.g., medicines, relaxation strategies). What has helped?     Took her dog for a walk---didn't really help 9. THERAPIST: Do you have a counselor or therapist? If Yes, ask: What is their name?     denies 10. POTENTIAL TRIGGERS: Do you drink caffeinated beverages (e.g., coffee, colas, teas), and how much daily? Do you drink alcohol or use any drugs? Have you started any new medicines recently?       unsure 11. PATIENT SUPPORT: Who is with you now? Who do you live with? Do you have family or friends who you can talk to?        Patient  states she lives alone-- 12. OTHER SYMPTOMS: Do you have any other symptoms? (e.g., feeling depressed, trouble concentrating, trouble sleeping, trouble breathing, palpitations or fast heartbeat, chest pain, sweating, nausea, or diarrhea)       Heart racing  Protocols used: Anxiety and Panic Attack-A-AH

## 2024-02-17 NOTE — Progress Notes (Signed)
 Established Patient Office Visit  Subjective   Patient ID: Barbara Edwards, female    DOB: 1960-09-07  Age: 63 y.o. MRN: [id number]  Chief Complaint  Patient presents with   Anxiety    Anxiety    F/u as above.  Usually sees Jacob Rothfuss, [place].  Patient has been struggling with months of worsening anxiety.  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  Has not been getting any therapy.  Admits to regular mood swings.  No alcohol or drugs.  Lexapro  has only provided limited relief.  Regular ongoing panic symptoms.  She really just uses Buspar  as needed.  Extended discussion.  Anxiety Symptoms include nervous/anxious behavior. Patient reports no chest pain, dizziness, insomnia, palpitations or suicidal ideas.      Past Medical History:  Diagnosis Date   Acute pyelonephritis 07/04/2020   Anemia 06/22/2019   Anxiety    Arthritis 2018   Asthma    Bipolar disorder (HCC)    Chronic kidney disease 2015   Depression    Fibromyalgia    Fibromyalgia 11/17/2020   GERD (gastroesophageal reflux disease)    Headache    History of kidney stones    Hyperlipidemia 01/08/2023   Insomnia    Migraines    Osteoporosis    Prediabetes 01/08/2023   Shortness of breath 01/08/2023   Sleep apnea     Outpatient Encounter Medications as of 02/17/2024  Medication Sig   ALPRAZolam (XANAX) 0.25 MG tablet Take 1 tablet (0.25 mg total) by mouth 2 (two) times daily as needed for anxiety (Sedation precautions).   busPIRone  (BUSPAR ) 7.5 MG tablet Take 1 tablet (7.5 mg total) by mouth 2 (two) times daily.   cefdinir (OMNICEF) 300 MG capsule Take 300 mg by mouth 2 (two) times daily.   cyclobenzaprine  (FLEXERIL ) 5 MG tablet Take 1 tablet (5 mg total) by mouth 3 (three) times daily as needed for muscle spasms.   escitalopram  (LEXAPRO ) 20 MG tablet Take 1 tablet (20 mg total) by mouth daily.   estradiol-norethindrone (ACTIVELLA) 1-0.5 MG tablet 1 tablet Orally Once a day;  Duration: 30 days   eszopiclone  (LUNESTA ) 2 MG TABS tablet Take 1 tablet (2 mg total) by mouth at bedtime as needed for sleep. Take immediately before bedtime   fluconazole  (DIFLUCAN ) 100 MG tablet Take by mouth.   gabapentin  (NEURONTIN ) 300 MG capsule Take 1 capsule (300 mg total) by mouth at bedtime.   lamoTRIgine (LAMICTAL) 25 MG tablet Take 1 tablet (25 mg total) by mouth daily.   lansoprazole  (PREVACID ) 30 MG capsule Oral; Duration: 30 Days   MAGNESIUM PO Take by mouth as needed.   omeprazole (PRILOSEC) 40 MG capsule Take 40 mg by mouth daily.   rizatriptan  (MAXALT ) 10 MG tablet Take 1 tablet (10 mg total) by mouth as needed for migraine. May repeat in 2 hours if needed   rosuvastatin  (CRESTOR ) 20 MG tablet Take 1 tablet (20 mg total) by mouth daily.   [DISCONTINUED] QUEtiapine  (SEROQUEL ) 25 MG tablet Take 1 tablet (25 mg total) by mouth at bedtime.   Ascorbic Acid (VITAMIN C PO) Take by mouth. (Patient not taking: Reported on 09/15/2023)   b complex vitamins capsule Take 1 capsule by mouth daily. (Patient taking differently: Take 1 capsule by mouth daily. UNDER TONGUE)   estradiol (ESTRACE) 0.5 MG tablet Take 0.5 mg by mouth daily. (Patient not taking: Reported on 09/15/2023)   No facility-administered encounter medications on file as of  02/17/2024.    Social History   Tobacco Use   Smoking status: Former    Types: Cigarettes    Passive exposure: Past   Smokeless tobacco: Never   Tobacco comments:    Quit in 2022, quit several times  Vaping Use   Vaping status: Never Used  Substance Use Topics   Alcohol use: Never   Drug use: Never      Review of Systems  Constitutional:  Positive for malaise/fatigue. Negative for weight loss.  Cardiovascular:  Negative for chest pain and palpitations.  Neurological:  Negative for dizziness.  Psychiatric/Behavioral:  Positive for depression. Negative for hallucinations, memory loss, substance abuse and suicidal ideas. The patient is  nervous/anxious. The patient does not have insomnia.       Objective:     BP 137/84 (Cuff Size: Normal)   Pulse 78   Temp 98.3 F (36.8 C) (Oral)   Resp 16   Wt 174 lb 3.2 oz (79 kg)   SpO2 95%   BMI 27.28 kg/m    Physical Exam Constitutional:      General: She is not in acute distress.    Appearance: Normal appearance.  HENT:     Head: Normocephalic.  Neck:     Vascular: No carotid bruit.  Cardiovascular:     Rate and Rhythm: Normal rate and regular rhythm.     Pulses: Normal pulses.     Heart sounds: Normal heart sounds.  Pulmonary:     Effort: Pulmonary effort is normal.     Breath sounds: Normal breath sounds.  Abdominal:     General: Bowel sounds are normal.     Palpations: Abdomen is soft.  Musculoskeletal:     Cervical back: Neck supple. No tenderness.     Right lower leg: No edema.     Left lower leg: No edema.  Neurological:     Mental Status: She is alert.  Psychiatric:        Mood and Affect: Mood normal.        Behavior: Behavior normal.        Thought Content: Thought content normal.        Judgment: Judgment normal.      No results found for any visits on 02/17/24.    The 10-year ASCVD risk score (Arnett DK, et al., 2019) is: 3.1%* (Cholesterol units were assumed)    Assessment & Plan:  Mood disorder Assessment & Plan: Urged to resume therapy soon, with a quality local group suggested.  MDQ completed and scanned.  Alert us  if not better in 1-2 weeks.  Orders: -     lamoTRIgine; Take 1 tablet (25 mg total) by mouth daily.  Dispense: 42 tablet; Refill: 1  Panic attacks Assessment & Plan: Continue Lexapro  for now.  Orders: -     ALPRAZolam; Take 1 tablet (0.25 mg total) by mouth 2 (two) times daily as needed for anxiety (Sedation precautions).  Dispense: 20 tablet; Refill: 0  I personally spent a total of 25 minutes in the care of the patient today including performing a medically appropriate exam/evaluation, counseling and  educating, documenting clinical information in the EHR, and communicating results.   Return in about 4 weeks (around 03/16/2024) for chronic follow-up.    REDDING PONCE NORLEEN FALCON., MD

## 2024-02-17 NOTE — Assessment & Plan Note (Signed)
 Urged to resume therapy soon, with a quality local group suggested.  MDQ completed and scanned.  Alert us  if not better in 1-2 weeks.

## 2024-03-06 ENCOUNTER — Other Ambulatory Visit: Payer: Self-pay | Admitting: Neurology

## 2024-03-06 DIAGNOSIS — G43009 Migraine without aura, not intractable, without status migrainosus: Secondary | ICD-10-CM

## 2024-03-10 ENCOUNTER — Other Ambulatory Visit: Payer: Self-pay | Admitting: Medical Genetics

## 2024-03-12 ENCOUNTER — Other Ambulatory Visit: Payer: Self-pay | Admitting: Neurology

## 2024-03-12 DIAGNOSIS — G43009 Migraine without aura, not intractable, without status migrainosus: Secondary | ICD-10-CM

## 2024-03-16 ENCOUNTER — Ambulatory Visit (HOSPITAL_BASED_OUTPATIENT_CLINIC_OR_DEPARTMENT_OTHER): Admitting: Student

## 2024-04-07 ENCOUNTER — Other Ambulatory Visit: Payer: Self-pay | Admitting: Neurology

## 2024-04-20 ENCOUNTER — Other Ambulatory Visit (HOSPITAL_BASED_OUTPATIENT_CLINIC_OR_DEPARTMENT_OTHER): Payer: Self-pay | Admitting: Student

## 2024-04-20 DIAGNOSIS — G2581 Restless legs syndrome: Secondary | ICD-10-CM

## 2024-05-11 ENCOUNTER — Other Ambulatory Visit (HOSPITAL_BASED_OUTPATIENT_CLINIC_OR_DEPARTMENT_OTHER): Payer: Self-pay | Admitting: Family Medicine

## 2024-05-11 DIAGNOSIS — F39 Unspecified mood [affective] disorder: Secondary | ICD-10-CM

## 2024-05-14 ENCOUNTER — Ambulatory Visit (HOSPITAL_BASED_OUTPATIENT_CLINIC_OR_DEPARTMENT_OTHER): Admitting: Family Medicine

## 2024-05-27 ENCOUNTER — Ambulatory Visit (HOSPITAL_BASED_OUTPATIENT_CLINIC_OR_DEPARTMENT_OTHER): Admitting: Family Medicine
